# Patient Record
Sex: Male | Born: 1985 | Race: Black or African American | Hispanic: No | Marital: Single | Smoking: Former smoker
Health system: Southern US, Community
[De-identification: ages and names within clinical notes are randomized; demographics above are authoritative.]

## PROBLEM LIST (undated history)

## (undated) DIAGNOSIS — E0840 Diabetes mellitus due to underlying condition with diabetic neuropathy, unspecified: Secondary | ICD-10-CM

## (undated) DIAGNOSIS — E079 Disorder of thyroid, unspecified: Secondary | ICD-10-CM

## (undated) DIAGNOSIS — J45909 Unspecified asthma, uncomplicated: Secondary | ICD-10-CM

## (undated) DIAGNOSIS — I10 Essential (primary) hypertension: Secondary | ICD-10-CM

## (undated) DIAGNOSIS — E7800 Pure hypercholesterolemia, unspecified: Secondary | ICD-10-CM

## (undated) DIAGNOSIS — E119 Type 2 diabetes mellitus without complications: Secondary | ICD-10-CM

## (undated) HISTORY — PX: OTHER SURGICAL HISTORY: SHX169

---
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
## 2003-02-14 ENCOUNTER — Encounter: Payer: Self-pay | Admitting: Emergency Medicine

## 2003-02-14 ENCOUNTER — Emergency Department (HOSPITAL_COMMUNITY): Admission: EM | Admit: 2003-02-14 | Discharge: 2003-02-14 | Payer: Self-pay | Admitting: Emergency Medicine

## 2003-06-28 ENCOUNTER — Emergency Department (HOSPITAL_COMMUNITY): Admission: EM | Admit: 2003-06-28 | Discharge: 2003-06-28 | Payer: Self-pay | Admitting: Emergency Medicine

## 2006-10-02 ENCOUNTER — Emergency Department (HOSPITAL_COMMUNITY): Admission: EM | Admit: 2006-10-02 | Discharge: 2006-10-02 | Payer: Self-pay | Admitting: Emergency Medicine

## 2007-05-03 ENCOUNTER — Emergency Department (HOSPITAL_COMMUNITY): Admission: EM | Admit: 2007-05-03 | Discharge: 2007-05-03 | Payer: Self-pay | Admitting: Emergency Medicine

## 2007-05-04 ENCOUNTER — Emergency Department (HOSPITAL_COMMUNITY): Admission: EM | Admit: 2007-05-04 | Discharge: 2007-05-04 | Payer: Self-pay | Admitting: Emergency Medicine

## 2007-05-06 ENCOUNTER — Emergency Department (HOSPITAL_COMMUNITY): Admission: EM | Admit: 2007-05-06 | Discharge: 2007-05-06 | Payer: Self-pay | Admitting: Emergency Medicine

## 2008-03-16 ENCOUNTER — Emergency Department (HOSPITAL_COMMUNITY): Admission: EM | Admit: 2008-03-16 | Discharge: 2008-03-16 | Payer: Self-pay | Admitting: Emergency Medicine

## 2009-08-21 ENCOUNTER — Emergency Department (HOSPITAL_COMMUNITY): Admission: EM | Admit: 2009-08-21 | Discharge: 2009-08-21 | Payer: Self-pay | Admitting: Emergency Medicine

## 2009-08-23 ENCOUNTER — Emergency Department (HOSPITAL_COMMUNITY): Admission: EM | Admit: 2009-08-23 | Discharge: 2009-08-23 | Payer: Self-pay | Admitting: Emergency Medicine

## 2009-08-23 ENCOUNTER — Ambulatory Visit (HOSPITAL_COMMUNITY): Admission: RE | Admit: 2009-08-23 | Discharge: 2009-08-23 | Payer: Self-pay | Admitting: Family Medicine

## 2009-10-22 ENCOUNTER — Encounter: Admission: RE | Admit: 2009-10-22 | Discharge: 2009-10-22 | Payer: Self-pay | Admitting: Internal Medicine

## 2009-10-22 ENCOUNTER — Other Ambulatory Visit: Admission: RE | Admit: 2009-10-22 | Discharge: 2009-10-22 | Payer: Self-pay | Admitting: Diagnostic Radiology

## 2009-10-24 ENCOUNTER — Ambulatory Visit (HOSPITAL_COMMUNITY): Admission: RE | Admit: 2009-10-24 | Discharge: 2009-10-24 | Payer: Self-pay | Admitting: Otolaryngology

## 2009-12-18 ENCOUNTER — Encounter (INDEPENDENT_AMBULATORY_CARE_PROVIDER_SITE_OTHER): Payer: Self-pay | Admitting: Otolaryngology

## 2009-12-18 ENCOUNTER — Inpatient Hospital Stay (HOSPITAL_COMMUNITY): Admission: RE | Admit: 2009-12-18 | Discharge: 2009-12-20 | Payer: Self-pay | Admitting: Otolaryngology

## 2010-03-13 ENCOUNTER — Inpatient Hospital Stay (HOSPITAL_COMMUNITY): Admission: EM | Admit: 2010-03-13 | Discharge: 2010-03-20 | Payer: Self-pay | Admitting: Emergency Medicine

## 2010-03-13 ENCOUNTER — Emergency Department (HOSPITAL_COMMUNITY): Admission: EM | Admit: 2010-03-13 | Discharge: 2010-03-13 | Payer: Self-pay | Admitting: Emergency Medicine

## 2010-07-22 LAB — GLUCOSE, CAPILLARY
Glucose-Capillary: 145 mg/dL — ABNORMAL HIGH (ref 70–99)
Glucose-Capillary: 151 mg/dL — ABNORMAL HIGH (ref 70–99)
Glucose-Capillary: 161 mg/dL — ABNORMAL HIGH (ref 70–99)
Glucose-Capillary: 199 mg/dL — ABNORMAL HIGH (ref 70–99)
Glucose-Capillary: 202 mg/dL — ABNORMAL HIGH (ref 70–99)
Glucose-Capillary: 239 mg/dL — ABNORMAL HIGH (ref 70–99)
Glucose-Capillary: 245 mg/dL — ABNORMAL HIGH (ref 70–99)
Glucose-Capillary: 261 mg/dL — ABNORMAL HIGH (ref 70–99)
Glucose-Capillary: 262 mg/dL — ABNORMAL HIGH (ref 70–99)
Glucose-Capillary: 263 mg/dL — ABNORMAL HIGH (ref 70–99)
Glucose-Capillary: 276 mg/dL — ABNORMAL HIGH (ref 70–99)
Glucose-Capillary: 292 mg/dL — ABNORMAL HIGH (ref 70–99)
Glucose-Capillary: 298 mg/dL — ABNORMAL HIGH (ref 70–99)
Glucose-Capillary: 304 mg/dL — ABNORMAL HIGH (ref 70–99)
Glucose-Capillary: 370 mg/dL — ABNORMAL HIGH (ref 70–99)
Glucose-Capillary: 446 mg/dL — ABNORMAL HIGH (ref 70–99)

## 2010-07-22 LAB — CBC
HCT: 35.8 % — ABNORMAL LOW (ref 39.0–52.0)
Hemoglobin: 12.1 g/dL — ABNORMAL LOW (ref 13.0–17.0)
Hemoglobin: 14.5 g/dL (ref 13.0–17.0)
MCH: 29.5 pg (ref 26.0–34.0)
MCHC: 33.6 g/dL (ref 30.0–36.0)
MCV: 88 fL (ref 78.0–100.0)
MCV: 90.5 fL (ref 78.0–100.0)
Platelets: 192 10*3/uL (ref 150–400)
RBC: 3.96 MIL/uL — ABNORMAL LOW (ref 4.22–5.81)
RDW: 16.1 % — ABNORMAL HIGH (ref 11.5–15.5)
RDW: 16.2 % — ABNORMAL HIGH (ref 11.5–15.5)
WBC: 4.8 10*3/uL (ref 4.0–10.5)
WBC: 6 10*3/uL (ref 4.0–10.5)

## 2010-07-22 LAB — BASIC METABOLIC PANEL
BUN: 1 mg/dL — ABNORMAL LOW (ref 6–23)
BUN: 2 mg/dL — ABNORMAL LOW (ref 6–23)
BUN: 2 mg/dL — ABNORMAL LOW (ref 6–23)
BUN: 3 mg/dL — ABNORMAL LOW (ref 6–23)
BUN: 4 mg/dL — ABNORMAL LOW (ref 6–23)
CO2: 20 mEq/L (ref 19–32)
CO2: 21 mEq/L (ref 19–32)
CO2: 21 mEq/L (ref 19–32)
Calcium: 9.2 mg/dL (ref 8.4–10.5)
Chloride: 101 mEq/L (ref 96–112)
Chloride: 101 mEq/L (ref 96–112)
Chloride: 106 mEq/L (ref 96–112)
Chloride: 99 mEq/L (ref 96–112)
Creatinine, Ser: 0.98 mg/dL (ref 0.4–1.5)
Creatinine, Ser: 1.03 mg/dL (ref 0.4–1.5)
Creatinine, Ser: 1.05 mg/dL (ref 0.4–1.5)
Creatinine, Ser: 1.21 mg/dL (ref 0.4–1.5)
Creatinine, Ser: 1.27 mg/dL (ref 0.4–1.5)
GFR calc Af Amer: >60 mL/min (ref >60)
GFR calc Af Amer: >60 mL/min (ref >60)
GFR calc Af Amer: >60 mL/min (ref >60)
GFR calc Af Amer: >60 mL/min (ref >60)
GFR calc Af Amer: >60 mL/min (ref >60)
GFR calc Af Amer: >60 mL/min (ref >60)
GFR calc non Af Amer: >60 mL/min (ref >60)
GFR calc non Af Amer: >60 mL/min (ref >60)
GFR calc non Af Amer: >60 mL/min (ref >60)
GFR calc non Af Amer: >60 mL/min (ref >60)
GFR calc non Af Amer: >60 mL/min (ref >60)
GFR calc non Af Amer: >60 mL/min (ref >60)
Glucose, Bld: 199 mg/dL — ABNORMAL HIGH (ref 70–99)
Glucose, Bld: 230 mg/dL — ABNORMAL HIGH (ref 70–99)
Glucose, Bld: 304 mg/dL — ABNORMAL HIGH (ref 70–99)
Potassium: 2.7 mEq/L — CL (ref 3.5–5.1)
Potassium: 3.1 mEq/L — ABNORMAL LOW (ref 3.5–5.1)
Potassium: 3.2 mEq/L — ABNORMAL LOW (ref 3.5–5.1)
Potassium: 3.9 mEq/L (ref 3.5–5.1)
Potassium: 3.9 mEq/L (ref 3.5–5.1)
Potassium: 4.1 mEq/L (ref 3.5–5.1)
Sodium: 133 mEq/L — ABNORMAL LOW (ref 135–145)
Sodium: 136 mEq/L (ref 135–145)
Sodium: 136 mEq/L (ref 135–145)
Sodium: 143 mEq/L (ref 135–145)

## 2010-07-22 LAB — URINE MICROSCOPIC-ADD ON

## 2010-07-22 LAB — DIFFERENTIAL
Basophils Absolute: 0 10*3/uL (ref 0.0–0.1)
Eosinophils Relative: 2 % (ref 0–5)
Lymphocytes Relative: 21 % (ref 12–46)
Lymphs Abs: 1.3 10*3/uL (ref 0.7–4.0)
Monocytes Absolute: 0.5 10*3/uL (ref 0.1–1.0)
Monocytes Absolute: 0.6 10*3/uL (ref 0.1–1.0)
Monocytes Relative: 10 % (ref 3–12)
Monocytes Relative: 11 % (ref 3–12)
Neutro Abs: 4 10*3/uL (ref 1.7–7.7)

## 2010-07-22 LAB — URINALYSIS, ROUTINE W REFLEX MICROSCOPIC
Glucose, UA: 500 mg/dL — AB
Ketones, ur: >80 mg/dL — AB
Protein, ur: 30 mg/dL — AB

## 2010-07-25 LAB — COMPREHENSIVE METABOLIC PANEL
Alkaline Phosphatase: 58 U/L (ref 39–117)
BUN: 14 mg/dL (ref 6–23)
CO2: 26 mEq/L (ref 19–32)
Chloride: 105 mEq/L (ref 96–112)
GFR calc non Af Amer: >60 mL/min (ref >60)
Glucose, Bld: 127 mg/dL — ABNORMAL HIGH (ref 70–99)
Potassium: 4.4 mEq/L (ref 3.5–5.1)
Total Bilirubin: 0.3 mg/dL (ref 0.3–1.2)
Total Protein: 7.4 g/dL (ref 6.0–8.3)

## 2010-07-25 LAB — SURGICAL PCR SCREEN: Staphylococcus aureus: NEGATIVE

## 2010-07-25 LAB — CBC
HCT: 43.9 % (ref 39.0–52.0)
MCV: 88.9 fL (ref 78.0–100.0)
RDW: 13.2 % (ref 11.5–15.5)
WBC: 6.6 10*3/uL (ref 4.0–10.5)

## 2010-07-25 LAB — HEPATIC FUNCTION PANEL
Bilirubin, Direct: <0.1 mg/dL (ref 0.0–0.3)
Total Bilirubin: 0.4 mg/dL (ref 0.3–1.2)

## 2010-07-25 LAB — TSH: TSH: 1.125 u[IU]/mL (ref 0.350–4.500)

## 2010-07-25 LAB — CALCIUM
Calcium: 8 mg/dL — ABNORMAL LOW (ref 8.4–10.5)
Calcium: 8.5 mg/dL (ref 8.4–10.5)

## 2010-07-30 LAB — RAPID STREP SCREEN (MED CTR MEBANE ONLY): Streptococcus, Group A Screen (Direct): NEGATIVE

## 2011-01-21 IMAGING — US US SOFT TISSUE HEAD/NECK
1 series · 13 of 25 positions shown · non-contrast
Comparison: Neck CT from 08/13/2009

CLINICAL DATA: 24-year-old with palpable thyroid nodules.

THYROID ULTRASOUND
TECHNIQUE: Ultrasound examination of the thyroid gland and adjacent
soft tissues was performed.

[Series 1: us soft tissue head/neck · 0.13mm/px · 13 of 69 slices shown]
[im 1/69]
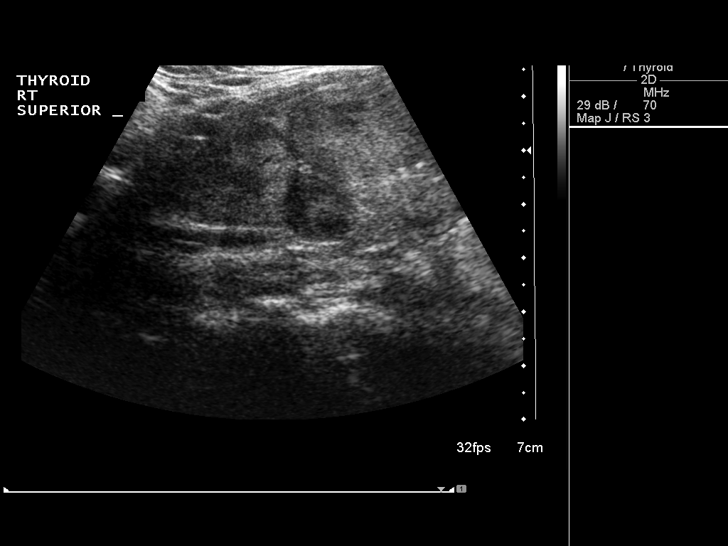
[im 6/69]
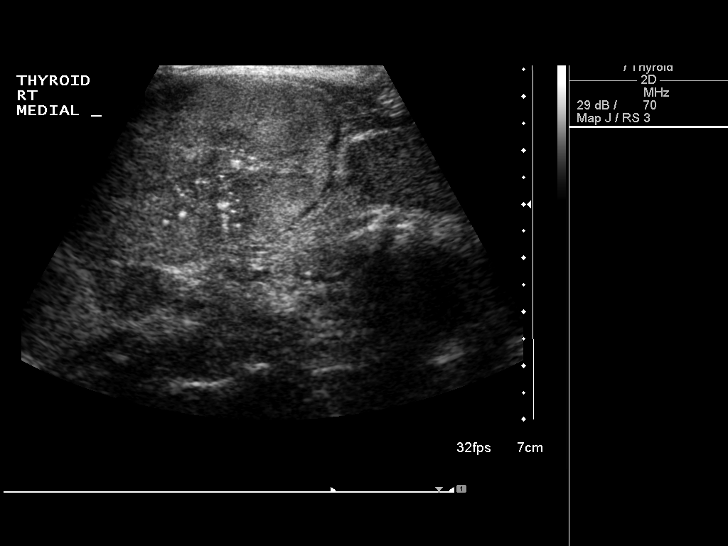
[im 12/69]
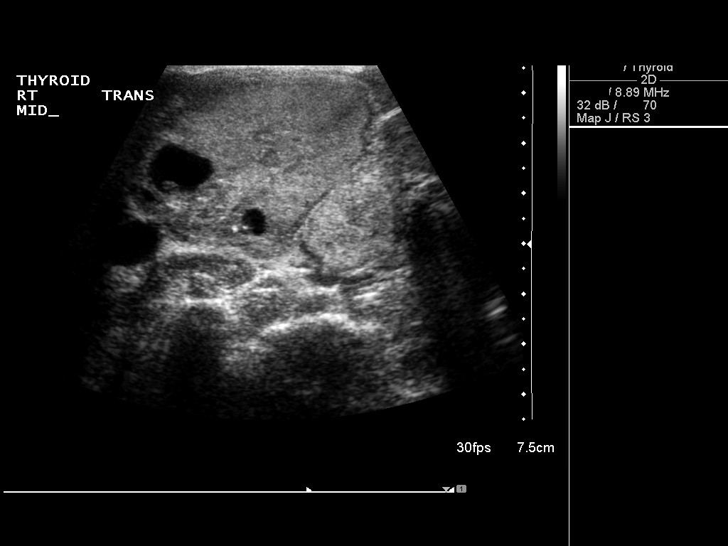
[im 18/69]
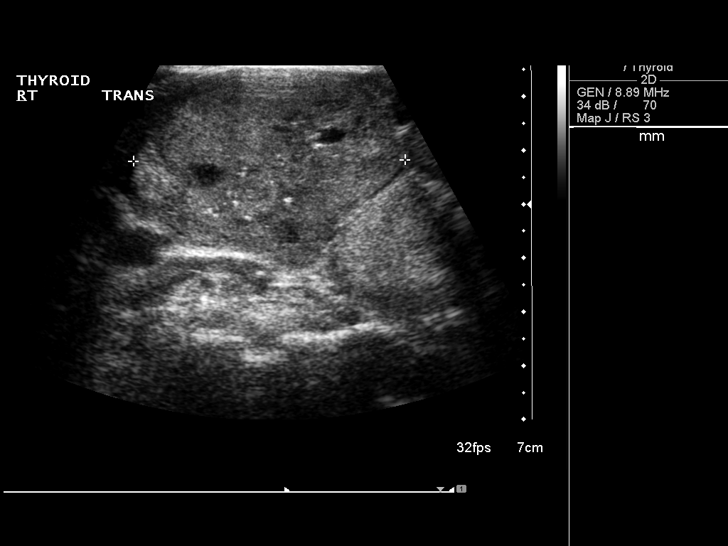
[im 23/69]
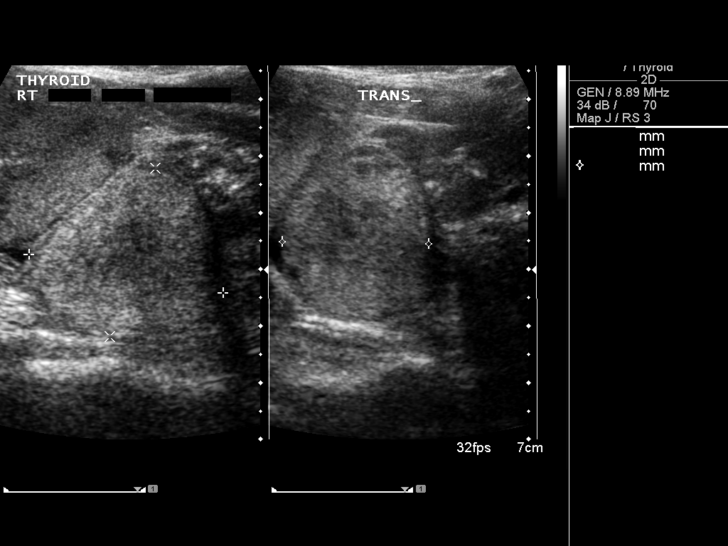
[im 29/69]
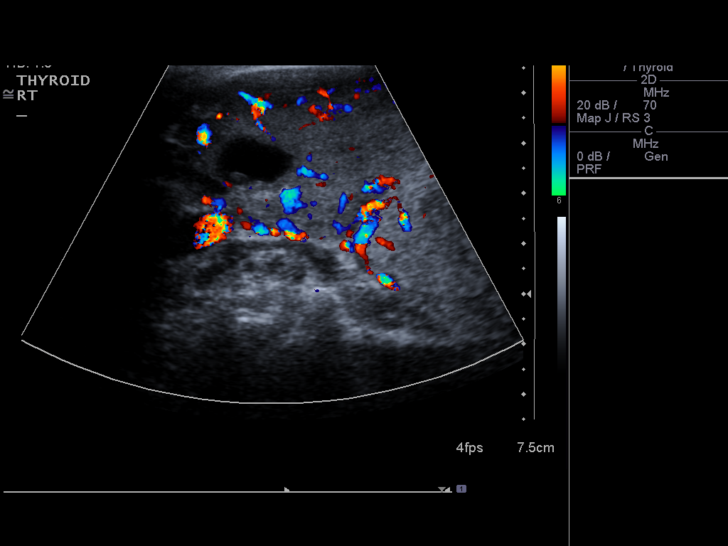
[im 35/69]
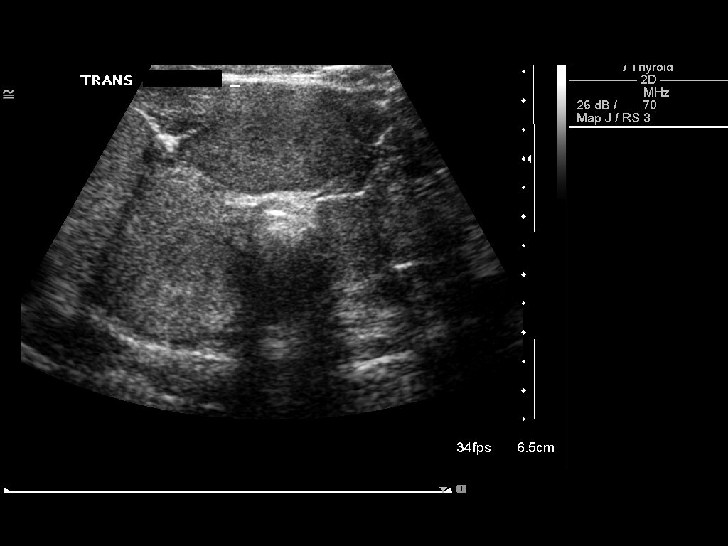
[im 40/69]
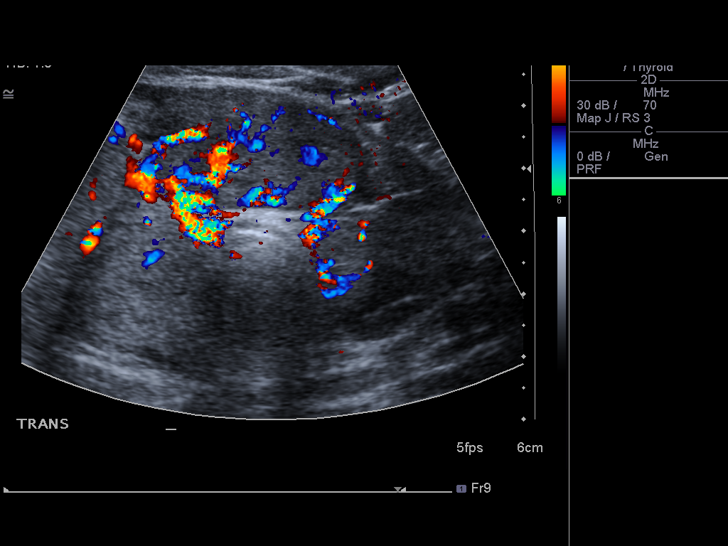
[im 46/69]
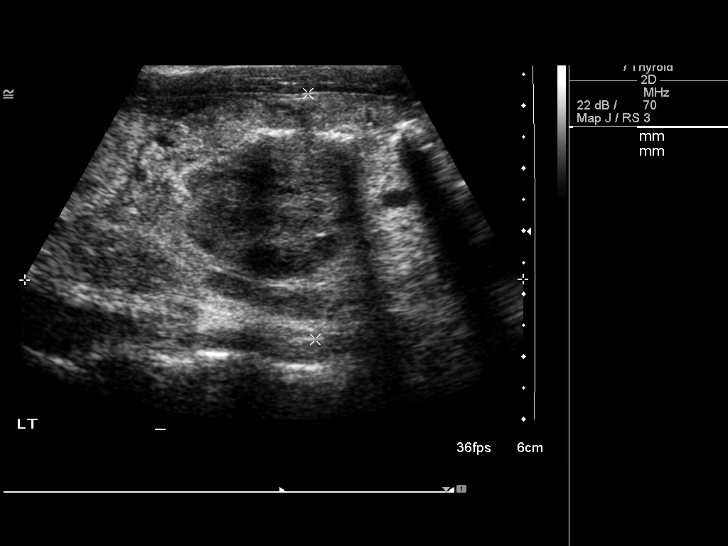
[im 52/69]
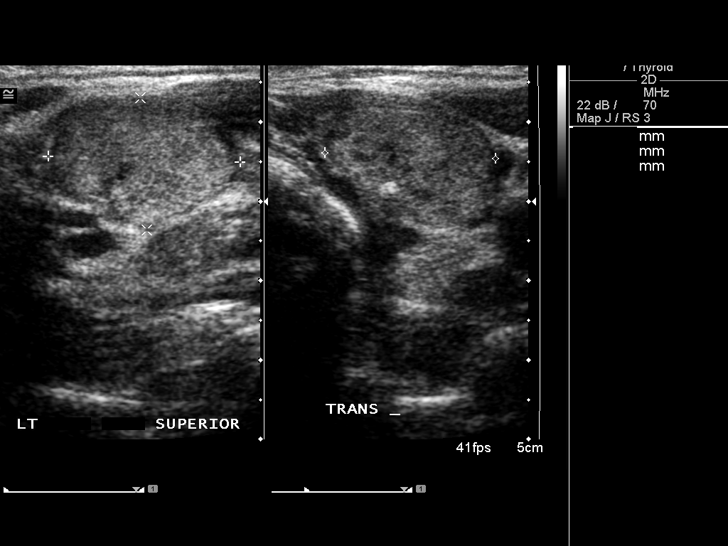
[im 57/69]
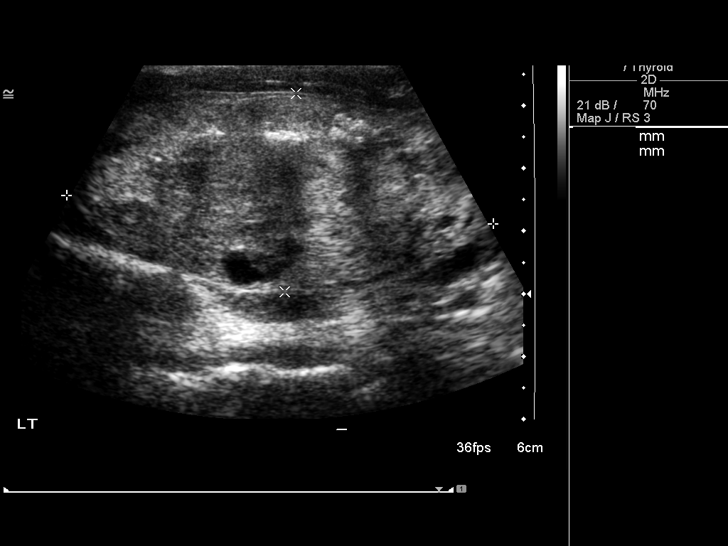
[im 63/69]
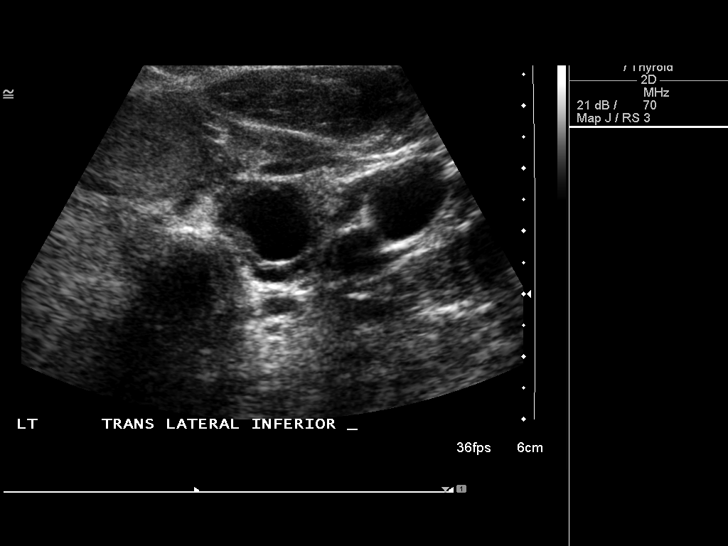
[im 69/69]
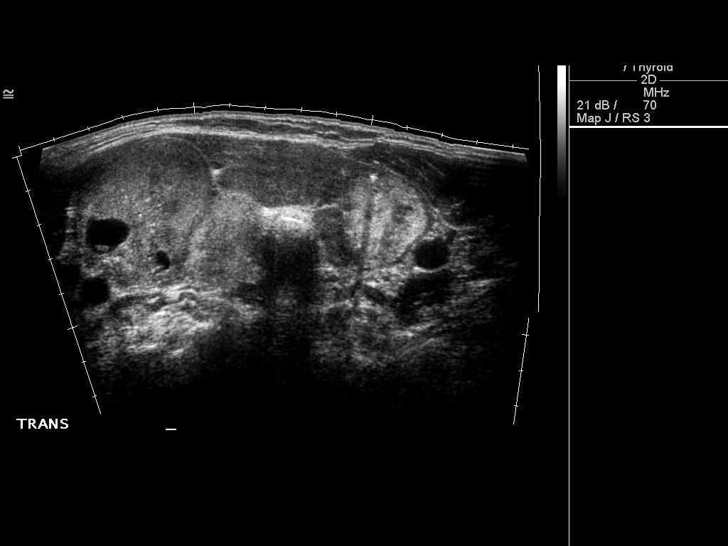

[13 of 25 positions shown; findings below may reference images not displayed]

FINDINGS: Right thyroid lobe:  10.5 x 4.1 x 6.3 cm
Left thyroid lobe:  9.1 x 3.9 x 4.2 cm
Isthmus:  2 cm

Focal nodules:  There are multiple bilateral solid nodules.  The
largest right thyroid nodule measures 6.2 x 3.4 x 5.1 cm and
contains microcalcifications and cysts.  There are at least four
additional large nodules in the right thyroid lobe.  The next
largest nodule is along the inferior pole measuring 4.1 x 3.1 x
cm. One of the smaller right thyroid nodules contains multiple
calcifications and measures up to 2.4 cm.  This 2.4 cm nodule is
located along the medial inferior aspect of the right thyroid lobe.

The isthmus nodule measures 5.5 x 1.7 x 3.4 cm.

Left thyroid lobe contains multiple nodules.  Dominant nodule is
complex with coarse calcifications and measures 6.8 x 3.2 x 4.1 cm.
There is a partially cystic lesion along the inferior aspect of the
left thyroid lobe measuring up to 3 cm.

Lymphadenopathy:  Absent
IMPRESSION: Multinodular goiter.  Multiple enlarged thyroid nodules containing
calcifications.   The dominant nodule in each thyroid lobe was fine
needle aspirated using ultrasound guidance following this
diagnostic procedure.

## 2011-01-23 IMAGING — CT CT NECK W/O CM
3 of 4 series · 16 of 33 positions shown, 19 images · non-contrast
Comparison: 08/23/2009

CLINICAL DATA: Goiter.  Evaluate response to Synthroid replacement.

CT NECK WITHOUT CONTRAST
TECHNIQUE: Multidetector CT imaging of the neck was performed
without intravenous contrast.

[Series 2: soft tissue neck 3.0 b31s · axial · 0.46mm/px · z∈[+32,+198]mm · 8 of 71 slices shown, 10 images]
[im 8/71  soft-tissue]
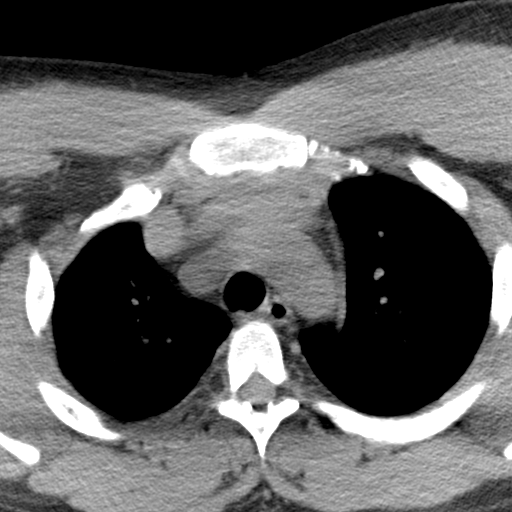
[im 8/71  bone]
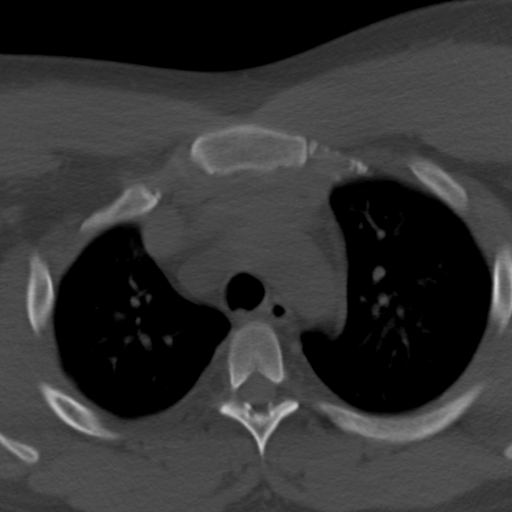
[im 16/71  bone]
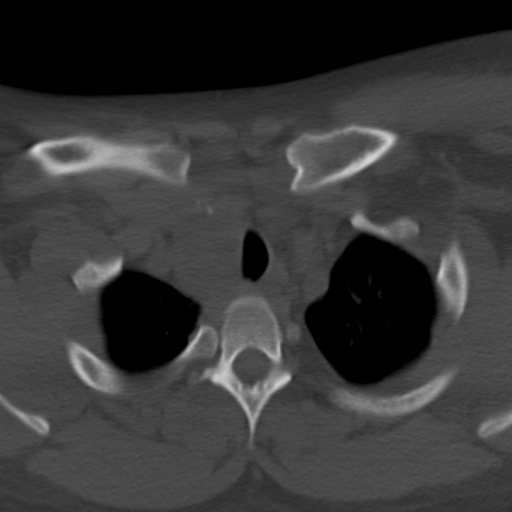
[im 24/71  bone]
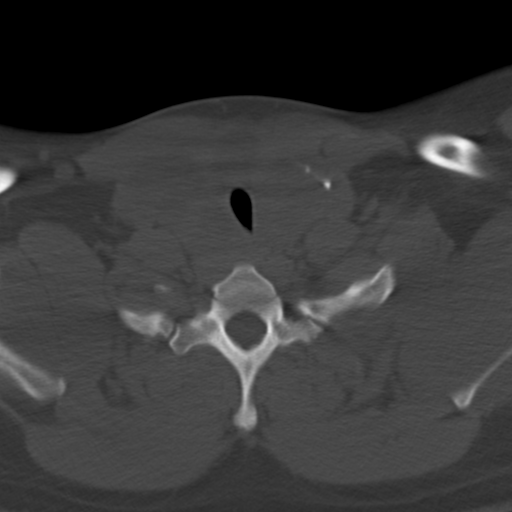
[im 32/71  bone]
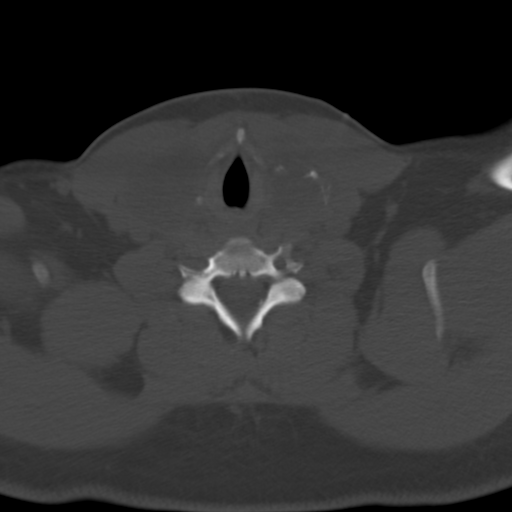
[im 39/71  soft-tissue]
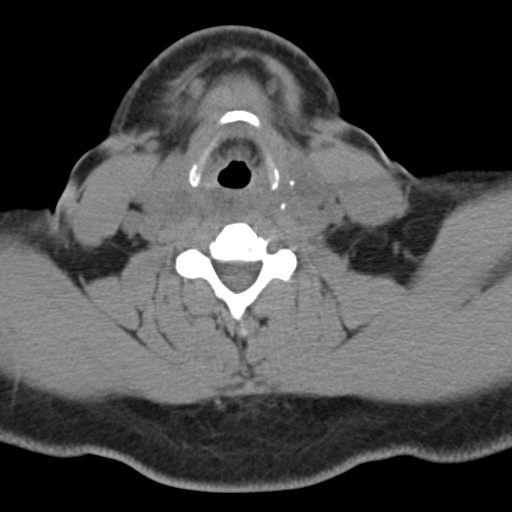
[im 39/71  bone]
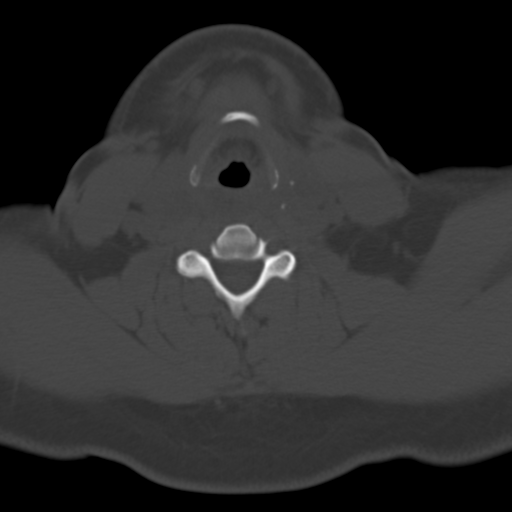
[im 47/71  bone]
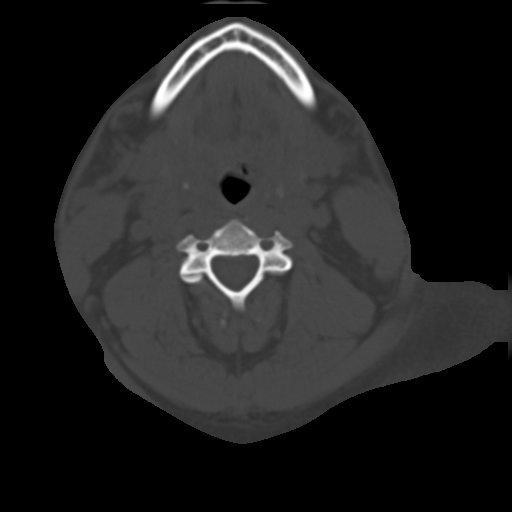
[im 55/71  bone]
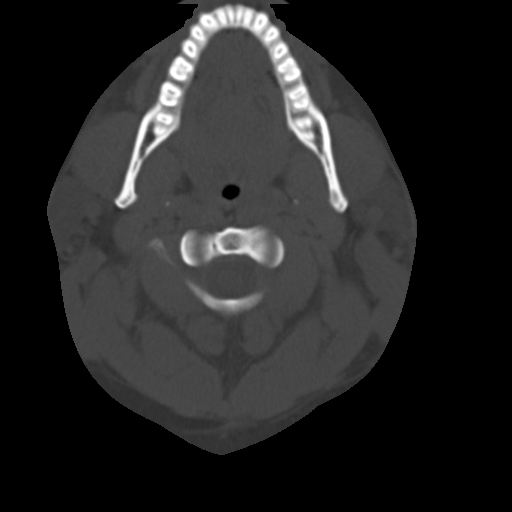
[im 63/71  bone]
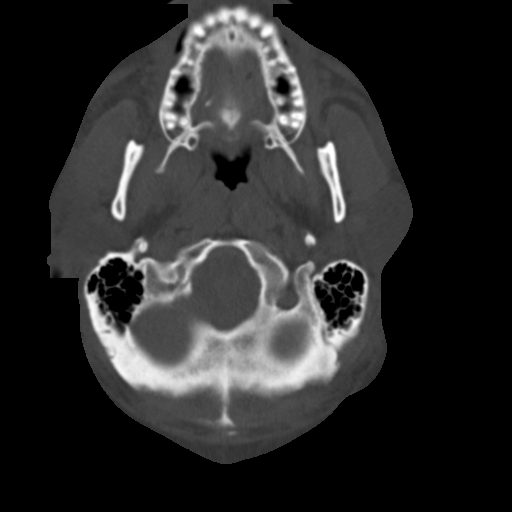

[Series 4: neck 3.0 soft tissue coronal · coronal · 0.35mm/px · 3 of 65 slices shown]
[im 13/65  bone]
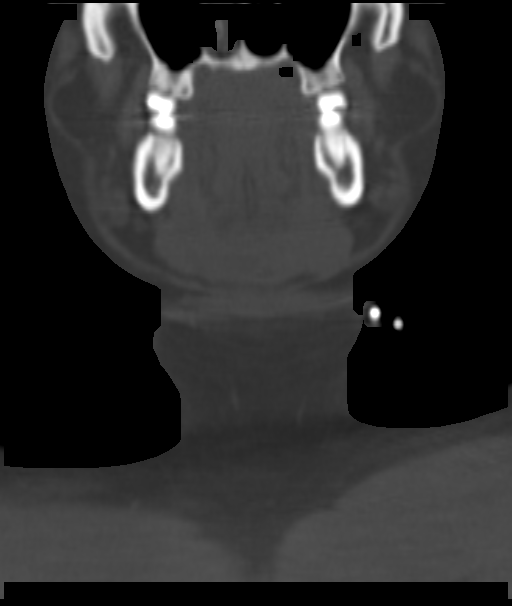
[im 26/65  bone]
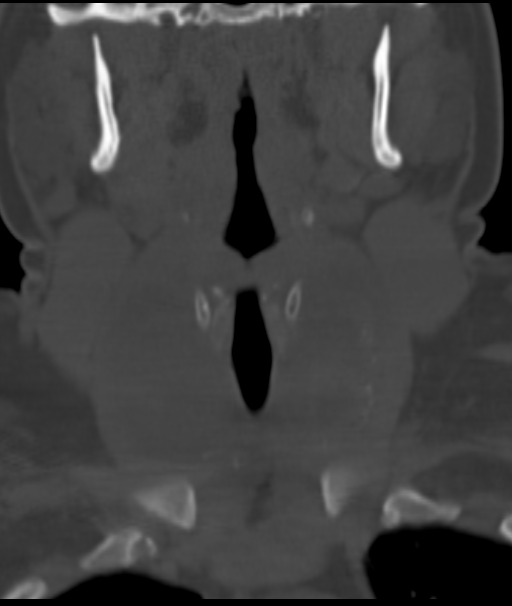
[im 39/65  bone]
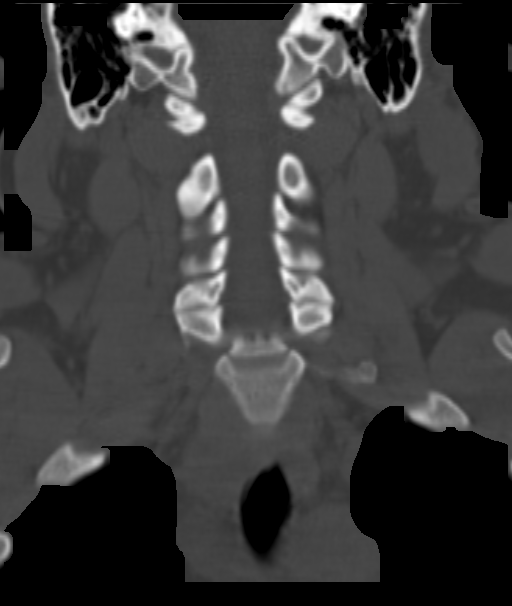

[Series 5: neck 3.0 soft tissue sag · sagittal · 0.41mm/px · 5 of 67 slices shown, 6 images]
[im 23/67  bone]
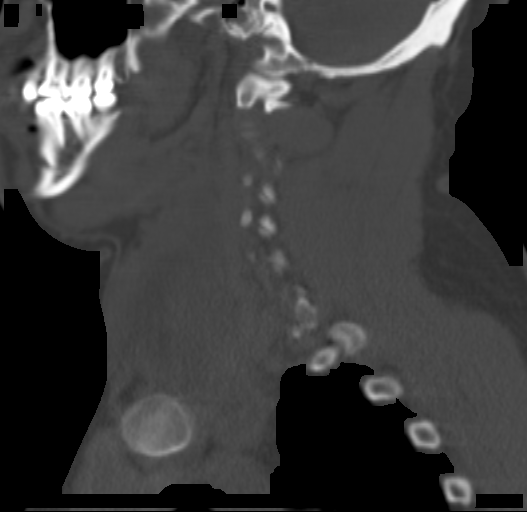
[im 28/67  bone]
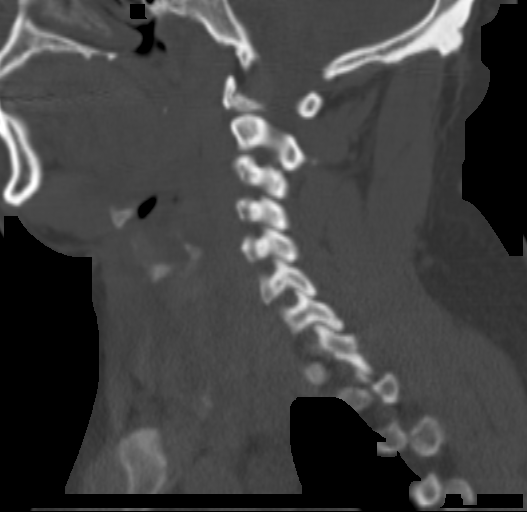
[im 34/67  soft-tissue]
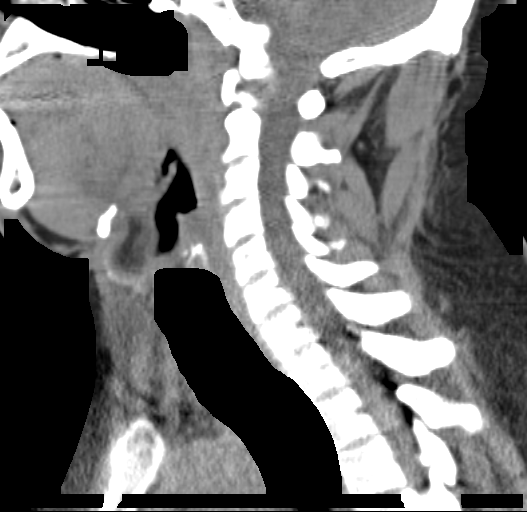
[im 34/67  bone]
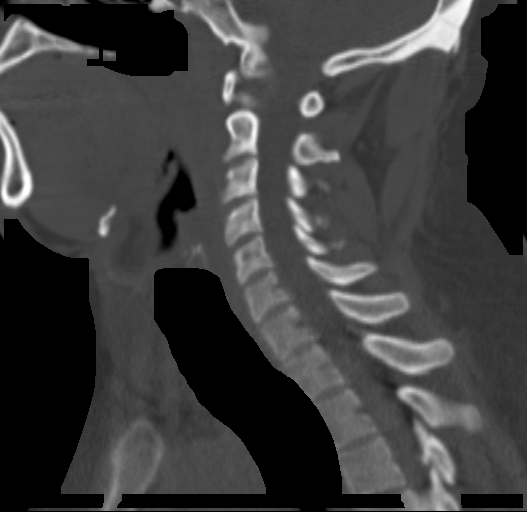
[im 39/67  bone]
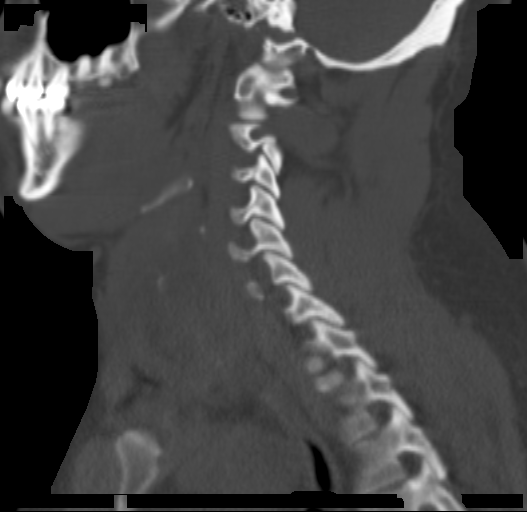
[im 45/67  bone]
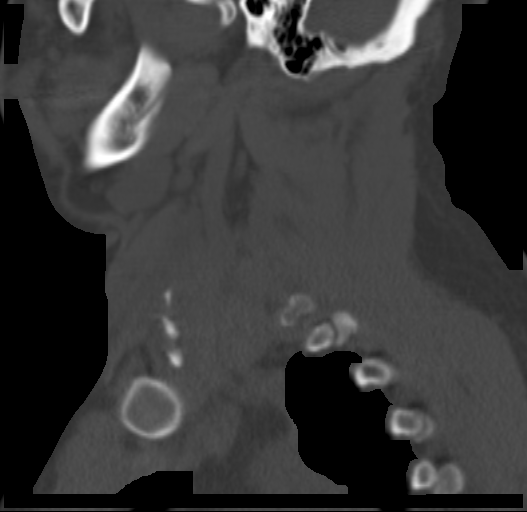

[16 of 33 positions shown; findings below may reference images not displayed]

FINDINGS: Again demonstrated is massive enlargement of the thyroid
gland with multiple nodules and scattered calcifications consistent
with multinodular goiter.  The gland is in fact somewhat smaller
than was seen previously.  Right to left measurement at the level
of the dens left lobe calcification is 11.2 cm as opposed to
cm previously.  Cephalocaudal measurement is also considerably
smaller, sagittal measurement of the right lobe decreasing from
12.5 cm to 9.7 cm.  There are no discernible enlarging foci.  There
is less mass effect upon the airway with the subglottic trachea
being slightly less compressed.

The other neck structures appear unremarkable.  Parotid and
submandibular glands appear normal.  No enlarged lymph nodes are
discernible.
IMPRESSION: Positive response to therapy.  Transverse measurement of the gland
is reduced from 12.8 cm to 11.2 cm. Cephalocaudal measurement is
reduced from 12.5 cm to 9.7 cm.  Less mass effect upon the
subglottic trachea.

## 2016-01-31 ENCOUNTER — Emergency Department (HOSPITAL_COMMUNITY)
Admission: EM | Admit: 2016-01-31 | Discharge: 2016-02-01 | Disposition: A | Discharge status: Home / Self Care | Payer: Medicare HMO | Attending: Emergency Medicine | Admitting: Emergency Medicine

## 2016-01-31 ENCOUNTER — Encounter (HOSPITAL_COMMUNITY): Payer: Self-pay

## 2016-01-31 DIAGNOSIS — J45909 Unspecified asthma, uncomplicated: Secondary | ICD-10-CM | POA: Diagnosis not present

## 2016-01-31 DIAGNOSIS — F10920 Alcohol use, unspecified with intoxication, uncomplicated: Secondary | ICD-10-CM

## 2016-01-31 DIAGNOSIS — F10120 Alcohol abuse with intoxication, uncomplicated: Secondary | ICD-10-CM | POA: Insufficient documentation

## 2016-01-31 DIAGNOSIS — R443 Hallucinations, unspecified: Secondary | ICD-10-CM | POA: Diagnosis not present

## 2016-01-31 DIAGNOSIS — F17210 Nicotine dependence, cigarettes, uncomplicated: Secondary | ICD-10-CM | POA: Insufficient documentation

## 2016-01-31 DIAGNOSIS — Z79899 Other long term (current) drug therapy: Secondary | ICD-10-CM | POA: Diagnosis not present

## 2016-01-31 DIAGNOSIS — F10929 Alcohol use, unspecified with intoxication, unspecified: Secondary | ICD-10-CM

## 2016-01-31 DIAGNOSIS — Z7984 Long term (current) use of oral hypoglycemic drugs: Secondary | ICD-10-CM | POA: Diagnosis not present

## 2016-01-31 DIAGNOSIS — Z046 Encounter for general psychiatric examination, requested by authority: Secondary | ICD-10-CM | POA: Diagnosis present

## 2016-01-31 DIAGNOSIS — I10 Essential (primary) hypertension: Secondary | ICD-10-CM | POA: Diagnosis not present

## 2016-01-31 DIAGNOSIS — E119 Type 2 diabetes mellitus without complications: Secondary | ICD-10-CM | POA: Insufficient documentation

## 2016-01-31 HISTORY — DX: Essential (primary) hypertension: I10

## 2016-01-31 HISTORY — DX: Type 2 diabetes mellitus without complications: E11.9

## 2016-01-31 HISTORY — DX: Unspecified asthma, uncomplicated: J45.909

## 2016-01-31 NOTE — ED Triage Notes (Signed)
Pt in by [facility] for being extremely intoxicated and making threats about harming other family members as well as about harming himself.  Pt states he is "crazy, but I'm a psychopath"

## 2016-02-01 DIAGNOSIS — F10929 Alcohol use, unspecified with intoxication, unspecified: Secondary | ICD-10-CM

## 2016-02-01 DIAGNOSIS — F10120 Alcohol abuse with intoxication, uncomplicated: Secondary | ICD-10-CM

## 2016-02-01 LAB — COMPREHENSIVE METABOLIC PANEL
ALT: 27 U/L (ref 17–63)
AST: 19 U/L (ref 15–41)
Albumin: 4.7 g/dL (ref 3.5–5.0)
Alkaline Phosphatase: 48 U/L (ref 38–126)
Anion gap: 12 (ref 5–15)
BILIRUBIN TOTAL: 0.3 mg/dL (ref 0.3–1.2)
BUN: 6 mg/dL (ref 6–20)
CALCIUM: 8.6 mg/dL — AB (ref 8.9–10.3)
CHLORIDE: 103 mmol/L (ref 101–111)
CO2: 23 mmol/L (ref 22–32)
CREATININE: 0.95 mg/dL (ref 0.61–1.24)
Glucose, Bld: 260 mg/dL — ABNORMAL HIGH (ref 65–99)
Potassium: 3.6 mmol/L (ref 3.5–5.1)
Sodium: 138 mmol/L (ref 135–145)
TOTAL PROTEIN: 8.3 g/dL — AB (ref 6.5–8.1)

## 2016-02-01 LAB — ETHANOL: ALCOHOL ETHYL (B): 255 mg/dL — AB (ref <5)

## 2016-02-01 LAB — CBC WITH DIFFERENTIAL/PLATELET
BASOS ABS: 0 10*3/uL (ref 0.0–0.1)
Basophils Relative: 0 %
EOS PCT: 2 %
Eosinophils Absolute: 0.1 10*3/uL (ref 0.0–0.7)
HEMATOCRIT: 42.4 % (ref 39.0–52.0)
Hemoglobin: 14.4 g/dL (ref 13.0–17.0)
LYMPHS ABS: 1.7 10*3/uL (ref 0.7–4.0)
LYMPHS PCT: 25 %
MCH: 30.8 pg (ref 26.0–34.0)
MCHC: 34 g/dL (ref 30.0–36.0)
MCV: 90.6 fL (ref 78.0–100.0)
MONO ABS: 0.4 10*3/uL (ref 0.1–1.0)
Monocytes Relative: 5 %
NEUTROS ABS: 4.6 10*3/uL (ref 1.7–7.7)
Neutrophils Relative %: 68 %
PLATELETS: 244 10*3/uL (ref 150–400)
RBC: 4.68 MIL/uL (ref 4.22–5.81)
RDW: 13.2 % (ref 11.5–15.5)
WBC: 6.9 10*3/uL (ref 4.0–10.5)

## 2016-02-01 LAB — RAPID URINE DRUG SCREEN, HOSP PERFORMED
Amphetamines: NOT DETECTED
Barbiturates: NOT DETECTED
Benzodiazepines: NOT DETECTED
Cocaine: NOT DETECTED
OPIATES: NOT DETECTED
Tetrahydrocannabinol: NOT DETECTED

## 2016-02-01 MED ORDER — LORAZEPAM 1 MG PO TABS: 1.0000 mg | ORAL_TABLET | Freq: Three times a day (TID) | ORAL | Status: DC | PRN

## 2016-02-01 MED ORDER — IBUPROFEN 400 MG PO TABS: 600.0000 mg | ORAL_TABLET | Freq: Three times a day (TID) | ORAL | Status: DC | PRN

## 2016-02-01 MED ORDER — ONDANSETRON HCL 4 MG PO TABS: 4.0000 mg | ORAL_TABLET | Freq: Three times a day (TID) | ORAL | Status: DC | PRN

## 2016-02-01 MED ORDER — NICOTINE 21 MG/24HR TD PT24: 21.0000 mg | MEDICATED_PATCH | Freq: Every day | TRANSDERMAL | Status: DC

## 2016-02-01 MED ORDER — ZOLPIDEM TARTRATE 5 MG PO TABS: 5.0000 mg | ORAL_TABLET | Freq: Every evening | ORAL | Status: DC | PRN

## 2016-02-01 MED ORDER — ACETAMINOPHEN 325 MG PO TABS: 650.0000 mg | ORAL_TABLET | ORAL | Status: DC | PRN

## 2016-02-01 MED ORDER — ALUM & MAG HYDROXIDE-SIMETH 200-200-20 MG/5ML PO SUSP
30.0000 mL | ORAL | Status: DC | PRN
Start: 2016-02-01 — End: 2016-02-01

## 2016-02-01 NOTE — Consult Note (Signed)
Telepsych Consultation   Reason for Consult:  Alcohol related SI Referring Physician: EDP Patient Identification: Craig Fischer MRN:  [id number] Principal Diagnosis: Alcohol intoxication [facility]) Diagnosis:   Patient Active Problem List   Diagnosis Date Noted  . Alcohol intoxication ([place]) [F10.129] 02/01/2016    Total Time spent with patient: 30 minutes  Subjective:   Craig Fischer is a 30 y.o. male patient admitted voluntarily to [facility] intoxicated reporting suicidal thoughts and upset over conflict with a cousin.   HPI:     Per initial Tele Assessment note:   Craig Fischer is an 30 y.o. single male who presents unaccompanied to [facility] ED. Pt is currently intoxicated with a blood alcohol of 255 and is a poor historian. Pt reports he came to the ED because he is "crazy in the head." He is unable to specify if anything hap pended today that contributed to this crisis. Pt reports suicidal ideation with "plenty of ways" to kill himself. He states he has tried to hand himself in the past. Pt states that he has thoughts of killing his cousin but denies current plan or intent "unless he disrespects my aunt." Pt reports he is experiencing auditory hallucinations but cannot describe anything about the nature of the hallucinations. Pt admits to drinking alcohol twice per month on average but cannot say how much he drinks or how much he drank within the past twenty-four hours. Pt denies substance use and urine drug screen is negative.  Pt reports he lives alone. He says he has a girlfriend who is supportive. Pt says he is receiving disability but is unable to explain the nature of his disability. He states he has no current outpatient mental health providers and Pt is unable to say if he has ever received outpatient mental health treatment. Pt denies any history of inpatient psychiatric or substance abuse treatment.  Patient spent the night in the [facility] after noted to be intoxicated during  initial assessment by counselor. He is calm and cooperative the morning of 02/01/2016. Patient states "I guess I snapped after my cousin kept telling people I'm gay. I am not. I am proposing to my girlfriend soon. I talked to my family who really helped calm me down. The more I thought about it the more I realized it's not worth it. Sometimes I feel depressed. I have heard voices before but not today. I would be willing to see a Psychiatrist. I have never had any mental health treatment before. I just want to go home to watch TV and listen to my music. I am going to stay away from the guy who upset me and caused me to snap. I feel alright now. My family really helped me calm down. I do not want to hurt myself or anyone else. I am not hearing a voices." Patient was alert and oriented times four. There were no signs of acute intoxication from alcohol. He denies any regular pattern of abuse. He is motivated to follow up outpatient to address family stressors and obtain help for report of some mild depressive symptoms. Spoke with Dr. Shea Evans who agrees the patient is stable to discharge home today. Discussed disposition with Dr. Lacinda Axon. Recommend that patient follow up with [facility] to address depressive symptoms and possible alcohol abuse. Patient denies any prior outpatient or inpatient treatment.    Past Psychiatric History: Denies  Risk to Self: Suicidal Ideation: Denies Suicidal Intent: No Is patient at risk for suicide?: Yes Suicidal Plan?: Denies Specify  Current Suicidal Plan: Denies Access to Means: Yes Specify Access to Suicidal Means: Denies What has been your use of drugs/alcohol within the last 12 months?: Pt is abusing alcohol How many times?: 0 (Unknown) Other Self Harm Risks: None Triggers for Past Attempts: Unknown Intentional Self Injurious Behavior: None Risk to Others: Homicidal Ideation: Denies currently Thoughts of Harm to Others: Denies currently  Comment - Thoughts of Harm to  Others: Denies this during assessment 02/01/2016  Current Homicidal Intent: No Current Homicidal Plan: No Access to Homicidal Means: No Identified Victim: Cousin History of harm to others?: No Assessment of Violence: None Noted Violent Behavior Description: Pt denies history of violence Does patient have access to weapons?: No Criminal Charges Pending?: No Does patient have a court date: No Prior Inpatient Therapy: Prior Inpatient Therapy: No Prior Therapy Dates: NA Prior Therapy Facilty/Provider(s): NA Reason for Treatment: NA Prior Outpatient Therapy: Prior Outpatient Therapy: No Prior Therapy Dates: NA Prior Therapy Facilty/Provider(s): NA Reason for Treatment: NA Does patient have an ACCT team?: No Does patient have Intensive In-House Services?  : No Does patient have [facility] services? : No Does patient have [facility] services?: No  Past Medical History:  Past Medical History:  Diagnosis Date  . Asthma   . Diabetes mellitus without complication ([place])   . Hypertension    History reviewed. No pertinent surgical history. Family History: No family history on file. Family Psychiatric  History: Denies Social History:  History  Alcohol Use  . Yes     History  Drug Use No    Social History   Social History  . Marital status: Single    Spouse name: N/A  . Number of children: N/A  . Years of education: N/A   Social History Main Topics  . Smoking status: Current Every Day Smoker  . Smokeless tobacco: Never Used  . Alcohol use Yes  . Drug use: No  . Sexual activity: Not Asked   Other Topics Concern  . None   Social History Narrative  . None   Additional Social History:    Allergies:   Allergies  Allergen Reactions  . Hydrocodone Other (See Comments)    Panic attack    Labs:  Results for orders placed or performed during the hospital encounter of 01/31/16 (from the past 48 hour(s))  CBC with Differential     Status: None   Collection Time: 02/01/16  1:12 AM   Result Value Ref Range   WBC 6.9 4.0 - 10.5 K/uL   RBC 4.68 4.22 - 5.81 MIL/uL   Hemoglobin 14.4 13.0 - 17.0 g/dL   HCT 42.4 39.0 - 52.0 %   MCV 90.6 78.0 - 100.0 fL   MCH 30.8 26.0 - 34.0 pg   MCHC 34.0 30.0 - 36.0 g/dL   RDW 13.2 11.5 - 15.5 %   Platelets 244 150 - 400 K/uL   Neutrophils Relative % 68 %   Neutro Abs 4.6 1.7 - 7.7 K/uL   Lymphocytes Relative 25 %   Lymphs Abs 1.7 0.7 - 4.0 K/uL   Monocytes Relative 5 %   Monocytes Absolute 0.4 0.1 - 1.0 K/uL   Eosinophils Relative 2 %   Eosinophils Absolute 0.1 0.0 - 0.7 K/uL   Basophils Relative 0 %   Basophils Absolute 0.0 0.0 - 0.1 K/uL  Comprehensive metabolic panel     Status: Abnormal   Collection Time: 02/01/16  1:12 AM  Result Value Ref Range   Sodium 138 135 - 145 mmol/L  Potassium 3.6 3.5 - 5.1 mmol/L   Chloride 103 101 - 111 mmol/L   CO2 23 22 - 32 mmol/L   Glucose, Bld 260 (H) 65 - 99 mg/dL   BUN 6 6 - 20 mg/dL   Creatinine, Ser 0.95 0.61 - 1.24 mg/dL   Calcium 8.6 (L) 8.9 - 10.3 mg/dL   Total Protein 8.3 (H) 6.5 - 8.1 g/dL   Albumin 4.7 3.5 - 5.0 g/dL   AST 19 15 - 41 U/L   ALT 27 17 - 63 U/L   Alkaline Phosphatase 48 38 - 126 U/L   Total Bilirubin 0.3 0.3 - 1.2 mg/dL   GFR calc non Af Amer >60 >60 mL/min   GFR calc Af Amer >60 >60 mL/min    Comment: (NOTE) The eGFR has been calculated using the CKD EPI equation. This calculation has not been validated in all clinical situations. eGFR's persistently <60 mL/min signify possible Chronic Kidney Disease.    Anion gap 12 5 - 15  Ethanol     Status: Abnormal   Collection Time: 02/01/16  1:12 AM  Result Value Ref Range   Alcohol, Ethyl (B) 255 (H) <5 mg/dL    Comment:        LOWEST DETECTABLE LIMIT FOR SERUM ALCOHOL IS 5 mg/dL FOR MEDICAL PURPOSES ONLY   Rapid urine drug screen (hospital performed)     Status: None   Collection Time: 02/01/16  1:30 AM  Result Value Ref Range   Opiates NONE DETECTED NONE DETECTED   Cocaine NONE DETECTED NONE  DETECTED   Benzodiazepines NONE DETECTED NONE DETECTED   Amphetamines NONE DETECTED NONE DETECTED   Tetrahydrocannabinol NONE DETECTED NONE DETECTED   Barbiturates NONE DETECTED NONE DETECTED    Comment:        DRUG SCREEN FOR MEDICAL PURPOSES ONLY.  IF CONFIRMATION IS NEEDED FOR ANY PURPOSE, NOTIFY LAB WITHIN 5 DAYS.        LOWEST DETECTABLE LIMITS FOR URINE DRUG SCREEN Drug Class       Cutoff (ng/mL) Amphetamine      1000 Barbiturate      200 Benzodiazepine   831 Tricyclics       517 Opiates          300 Cocaine          300 THC              50     Current Facility-Administered Medications  Medication Dose Route Frequency Provider Last Rate Last Dose  . acetaminophen (TYLENOL) tablet 650 mg  650 mg Oral O1Y PRN Delora Fuel, MD      . alum & mag hydroxide-simeth (MAALOX/MYLANTA) 200-200-20 MG/5ML suspension 30 mL  30 mL Oral PRN Delora Fuel, MD      . ibuprofen (ADVIL,MOTRIN) tablet 600 mg  600 mg Oral W7P PRN Delora Fuel, MD      . LORazepam (ATIVAN) tablet 1 mg  1 mg Oral X1G PRN Delora Fuel, MD      . nicotine (NICODERM CQ - dosed in mg/24 hours) patch 21 mg  21 mg Transdermal Daily Delora Fuel, MD   Stopped at 02/01/16 [phone]  . ondansetron (ZOFRAN) tablet 4 mg  4 mg Oral S8N PRN Delora Fuel, MD      . zolpidem [facility]) tablet 5 mg  5 mg Oral QHS PRN Delora Fuel, MD       No current outpatient prescriptions on file.    Musculoskeletal:  Unable to assess via camera  Psychiatric Specialty Exam: Physical Exam  Review of Systems  Constitutional: Negative.   HENT: Negative.   Eyes: Negative.   Respiratory: Negative.   Cardiovascular: Negative.   Gastrointestinal: Negative.   Genitourinary: Negative.   Musculoskeletal: Negative.   Skin: Negative.   Neurological: Negative.   Endo/Heme/Allergies: Negative.   Psychiatric/Behavioral: Positive for depression (Reports mild depressive symptoms) and substance abuse. Negative for hallucinations, memory loss and suicidal  ideas. The patient is not nervous/anxious and does not have insomnia.     Blood pressure 125/87, pulse 98, temperature 97.6 F (36.4 C), temperature source Oral, resp. rate 20, height 5' 8" (1.727 m), weight 121.6 kg (268 lb), SpO2 97 %.Body mass index is 40.75 kg/m.  General Appearance: Casual  Eye Contact:  Good  Speech:  Clear and Coherent  Volume:  Normal  Mood:  Euthymic  Affect:  Appropriate  Thought Process:  Coherent and Goal Directed  Orientation:  Full (Time, Place, and Person)  Thought Content:  WDL  Suicidal Thoughts:  No  Homicidal Thoughts:  No  Memory:  Immediate;   Good Recent;   Good Remote;   Good  Judgement:  Fair  Insight:  Present  Psychomotor Activity:  Normal  Concentration:  Concentration: Good and Attention Span: Good  Recall:  Good  Fund of Knowledge:  Good  Language:  Good  Akathisia:  No  Handed:  Right  AIMS (if indicated):     Assets:  Communication Skills Desire for Improvement Housing Intimacy Leisure Time Physical Health Resilience Social Support Talents/Skills  ADL's:  Intact  Cognition:  WNL  Sleep:        Treatment Plan Summary: Patient does not meet criteria for inpatient admission at this time. Is willing to follow up with outpatient treatment to pursue resources in the [place] area that accept his medicaid.   Disposition: No evidence of imminent risk to self or others at present.   Patient does not meet criteria for psychiatric inpatient admission. Supportive therapy provided about ongoing stressors.  Elmarie Shiley, NP 02/01/2016 10:52 AM

## 2016-02-01 NOTE — ED Notes (Signed)
TTS machine placed at bedside.   

## 2016-02-01 NOTE — ED Notes (Signed)
 PD signoff on patient, patient has sitter at bedside.

## 2016-02-01 NOTE — ED Provider Notes (Signed)
No suicidal or homicidal ideation. Behavioral health consult obtained. Patient does not meet inpatient criteria. Will discharge to outpatient counseling.   Donnetta HutchingBrian Jonnette Nuon, MD 02/01/16 1252

## 2016-02-01 NOTE — BH Assessment (Signed)
Tele Assessment Note   Craig StagersMarcus M Limpert is an 30 y.o. single male who presents unaccompanied to [facility] ED. Pt is currently intoxicated with a blood alcohol of 255 and is a poor historian. Pt reports he came to the ED because he is "crazy in the head." He is unable to specify if anything hap pended today that contributed to this crisis. Pt reports suicidal ideation with "plenty of ways" to kill himself. He states he has tried to hand himself in the past. Pt states that he has thoughts of killing his cousin but denies current plan or intent "unless he disrespects my aunt." Pt reports he is experiencing auditory hallucinations but cannot describe anything about the nature of the hallucinations. Pt admits to drinking alcohol twice per month on average but cannot say how much he drinks or how much he drank within the past twenty-four hours. Pt denies substance use and urine drug screen is negative.  Pt reports he lives alone. He says he has a girlfriend who is supportive. Pt says he is receiving disability but is unable to explain the nature of his disability. He states he has no current outpatient mental health providers and Pt is unable to say if he has ever received outpatient mental health treatment. Pt denies any history of inpatient psychiatric or substance abuse treatment.  Pt is dressed in hospital scrubs bottom with no top. He is drowsy and oriented to person, place and situation but not date. His speech is slurred and motor behavior appear to be within normal limits. Eye contact is poor and Pt often covered his eyes with his arm or a blanket. Pt's mood is irritable and affect is congruent with mood. Thought process is coherent but tangential at times. Pt gave brief answers to questions. Pt states he is willing to sign himself into a psychiatric facility "as long as it doesn't bother my landlord."   Diagnosis: Alcohol Use Disorder; Unspecified Psychotic Disorder  Past Medical History:  Past  Medical History:  Diagnosis Date  . Asthma   . Diabetes mellitus without complication (HCC)   . Hypertension     History reviewed. No pertinent surgical history.  Family History: No family history on file.  Social History:  reports that he has been smoking.  He has never used smokeless tobacco. He reports that he drinks alcohol. He reports that he does not use drugs.  Additional Social History:  Alcohol / Drug Use Pain Medications: Denies use Prescriptions: See MAR Over the Counter: See MAR History of alcohol / drug use?: Yes Longest period of sobriety (when/how long): Unknown Substance #1 Name of Substance 1: Alcohol 1 - Age of First Use: Unknown 1 - Amount (size/oz): Pt unable to estimate 1 - Frequency: Averages twice per month 1 - Duration: Ongoing 1 - Last Use / Amount: 01/31/16, unknown  CIWA: CIWA-Ar BP: 135/87 Pulse Rate: (!) 140 COWS:    PATIENT STRENGTHS: (choose at least two) Capable of independent living Manufacturing systems engineerCommunication skills Motivation for treatment/growth Physical Health Supportive family/friends  Allergies:  Allergies  Allergen Reactions  . Hydrocodone Other (See Comments)    Panic attack    Home Medications:  (Not in a hospital admission)  OB/GYN Status:  No LMP for male patient.  General Assessment Data Location of Assessment: [facility] TTS Assessment: In system Is this a Tele or Face-to-Face Assessment?: Tele Assessment Is this an Initial Assessment or a Re-assessment for this encounter?: Initial Assessment Marital status: Single Maiden name: NA Is patient  pregnant?: No Pregnancy Status: No Living Arrangements: Alone Can pt return to current living arrangement?: Yes Admission Status: Involuntary Is patient capable of signing voluntary admission?: Yes Referral Source: Self/Family/Friend Insurance type: Medicaid     Crisis Care Plan Living Arrangements: Alone Legal Guardian: Other: (Self) Name of Psychiatrist: None Name of Therapist:  None  Education Status Is patient currently in school?: No Current Grade: NA Highest grade of school patient has completed: 12 Name of school: NA Contact person: NA  Risk to self with the past 6 months Suicidal Ideation: Yes-Currently Present Has patient been a risk to self within the past 6 months prior to admission? : Yes Suicidal Intent: No Has patient had any suicidal intent within the past 6 months prior to admission? : Yes Is patient at risk for suicide?: Yes Suicidal Plan?: Yes-Currently Present Has patient had any suicidal plan within the past 6 months prior to admission? : Yes Specify Current Suicidal Plan: "Plenty of ways" Access to Means: Yes Specify Access to Suicidal Means: Pt reports "I could do it" What has been your use of drugs/alcohol within the last 12 months?: Pt is abusing alcohol Previous Attempts/Gestures: Yes How many times?: 0 (Unknown) Other Self Harm Risks: None Triggers for Past Attempts: Unknown Intentional Self Injurious Behavior: None Family Suicide History: No Recent stressful life event(s): Conflict (Comment) (Conflict with cousin) Persecutory voices/beliefs?: No Depression: Yes Depression Symptoms: Despondent, Tearfulness, Isolating, Fatigue, Loss of interest in usual pleasures, Feeling worthless/self pity, Feeling angry/irritable Substance abuse history and/or treatment for substance abuse?: Yes Suicide prevention information given to non-admitted patients: Not applicable  Risk to Others within the past 6 months Homicidal Ideation: Yes-Currently Present Does patient have any lifetime risk of violence toward others beyond the six months prior to admission? : No Thoughts of Harm to Others: Yes-Currently Present Comment - Thoughts of Harm to Others: Pt reports thought of hurting cousin Current Homicidal Intent: No Current Homicidal Plan: No Access to Homicidal Means: No Identified Victim: Cousin History of harm to others?: No Assessment of  Violence: None Noted Violent Behavior Description: Pt denies history of violence Does patient have access to weapons?: No Criminal Charges Pending?: No Does patient have a court date: No Is patient on probation?: No  Psychosis Hallucinations: Auditory (Pt cannot describe auditory hallucinations) Delusions: None noted  Mental Status Report Appearance/Hygiene: In scrubs Eye Contact: Poor Motor Activity: Unremarkable Speech: Slurred Level of Consciousness: Drowsy Mood: Irritable Affect: Irritable Anxiety Level: None Thought Processes: Coherent Judgement: Impaired Orientation: Person, Place, Time, Situation, Appropriate for developmental age Obsessive Compulsive Thoughts/Behaviors: None  Cognitive Functioning Concentration: Decreased Memory: Recent Intact, Remote Intact IQ: Average Insight: Poor Impulse Control: Fair Appetite: Good Weight Loss: 0 Weight Gain: 0 Sleep: Decreased Total Hours of Sleep: 4 Vegetative Symptoms: None  ADLScreening [facility] Assessment Services) Patient's cognitive ability adequate to safely complete daily activities?: Yes Patient able to express need for assistance with ADLs?: Yes Independently performs ADLs?: Yes (appropriate for developmental age)  Prior Inpatient Therapy Prior Inpatient Therapy: No Prior Therapy Dates: NA Prior Therapy Facilty/Provider(s): NA Reason for Treatment: NA  Prior Outpatient Therapy Prior Outpatient Therapy: No Prior Therapy Dates: NA Prior Therapy Facilty/Provider(s): NA Reason for Treatment: NA Does patient have an ACCT team?: No Does patient have Intensive In-House Services?  : No Does patient have [facility] services? : No Does patient have P4CC services?: No  ADL Screening (condition at time of admission) Patient's cognitive ability adequate to safely complete daily activities?: Yes Is the patient deaf or  have difficulty hearing?: No Does the patient have difficulty seeing, even when wearing  glasses/contacts?: No Does the patient have difficulty concentrating, remembering, or making decisions?: No Patient able to express need for assistance with ADLs?: Yes Does the patient have difficulty dressing or bathing?: No Independently performs ADLs?: Yes (appropriate for developmental age) Does the patient have difficulty walking or climbing stairs?: No Weakness of Legs: None Weakness of Arms/Hands: None  Home Assistive Devices/Equipment Home Assistive Devices/Equipment: None    Abuse/Neglect Assessment (Assessment to be complete while patient is alone) Physical Abuse: Denies Verbal Abuse: Denies Sexual Abuse: Denies Exploitation of patient/patient's resources: Denies Self-Neglect: Denies     Merchant navy officer (For Healthcare) Does patient have an advance directive?: No Would patient like information on creating an advanced directive?: No - patient declined information    Additional Information 1:1 In Past 12 Months?: No CIRT Risk: No Elopement Risk: No Does patient have medical clearance?: Yes     Disposition: Gave clinical report to Nira Conn, NP who recommends Pt be evaluated by psychiatry this morning when Pt is more sober. Notified Dr. Preston Fleeting of recommendation.  Disposition Initial Assessment Completed for this Encounter: Yes Disposition of Patient: Other dispositions Other disposition(s): Other (Comment)   Pamalee Leyden, [facility], [facility], [facility] Triage Specialist [phone]   Patsy Baltimore, Harlin Rain 02/01/2016 5:10 AM

## 2016-02-01 NOTE — ED Provider Notes (Signed)
[facility] Provider Note   CSN: [id number] Arrival date & time: 01/31/16  2339  By signing my name below, I, Craig Fischer, attest that this documentation has been prepared under the direction and in the presence of Dione Boozeavid Walton Digilio, MD . Electronically Signed: Modena JanskyAlbert Fischer, Scribe. 02/01/2016. 12:12 AM.  History   Chief Complaint Chief Complaint  Patient presents with  . V70.1   The history is provided by the patient and the police. No language interpreter was used.   HPI Comments: Craig Fischer is a 30 y.o. male who presents to the Emergency Department for alcohol intoxication. He reports "he is crazy psychopath but a good man". Reports auditory hallucinations and HI. Denies SI or drug use.  Past Medical History:  Diagnosis Date  . Asthma   . Diabetes mellitus without complication (HCC)   . Hypertension     There are no active problems to display for this patient.   History reviewed. No pertinent surgical history.     Home Medications    Prior to Admission medications   Not on File    Family History No family history on file.  Social History Social History  Substance Use Topics  . Smoking status: Current Every Day Smoker  . Smokeless tobacco: Never Used  . Alcohol use Yes     Allergies   Hydrocodone   Review of Systems Review of Systems  Psychiatric/Behavioral: Positive for behavioral problems and hallucinations (Auditory). Negative for suicidal ideas.  All other systems reviewed and are negative.    Physical Exam Updated Vital Signs BP 126/96 (BP Location: Left Arm)   Pulse (!) 163 Comment: Made nurse aware  Temp 98.9 F (37.2 C) (Oral)   Resp 22   Ht 5\' 8"  (1.727 m)   Wt 268 lb (121.6 kg)   SpO2 96%   BMI 40.75 kg/m   Physical Exam  Constitutional: He is oriented to person, place, and time. He appears well-developed and well-nourished.  HENT:  Head: Normocephalic and atraumatic.  Eyes: EOM are normal. Pupils are equal, round,  and reactive to light.  Neck: Normal range of motion. Neck supple. No JVD present.  Cardiovascular: Normal rate, regular rhythm and normal heart sounds.   No murmur heard. Pulmonary/Chest: Effort normal and breath sounds normal. He has no wheezes. He has no rales. He exhibits no tenderness.  Abdominal: Soft. Bowel sounds are normal. He exhibits no distension and no mass. There is no tenderness.  Musculoskeletal: Normal range of motion. He exhibits no edema.  Lymphadenopathy:    He has no cervical adenopathy.  Neurological: He is alert and oriented to person, place, and time. No cranial nerve deficit. He exhibits normal muscle tone. Coordination normal.  Skin: Skin is warm and dry. No rash noted.  Psychiatric:  Admits to auditory hallucinations with HI. Moderate dysphoria.   Nursing note and vitals reviewed.    ED Treatments / Results  DIAGNOSTIC STUDIES: Oxygen Saturation is 96% on RA, normal by my interpretation.    COORDINATION OF CARE: 12:19 AM- Pt advised of plan for treatment and pt agrees.  Labs (all labs ordered are listed, but only abnormal results are displayed) Labs Reviewed  COMPREHENSIVE METABOLIC PANEL - Abnormal; Notable for the following:       Result Value   Glucose, Bld 260 (*)    Calcium 8.6 (*)    Total Protein 8.3 (*)    All other components within normal limits  ETHANOL - Abnormal; Notable for the following:  Alcohol, Ethyl (B) 255 (*)    All other components within normal limits  CBC WITH DIFFERENTIAL/PLATELET  URINE RAPID DRUG SCREEN, HOSP PERFORMED    EKG  EKG Interpretation  Date/Time:  Saturday February 01 2016 00:00:37 EDT Ventricular Rate:  145 PR Interval:    QRS Duration: 85 QT Interval:  279 QTC Calculation: 434 R Axis:   67 Text Interpretation:  Sinus tachycardia Multiple ventricular premature complexes Repol abnrm suggests ischemia, diffuse leads Baseline wander in lead(s) V4 When compared with ECG of 08/23/2009, HEART RATE has  increased Premature ventricular complexes are now Present REPOLARIZATION ABNORMALITY is now Present - possibly rate-related Confirmed by [facility]  MD, Aryaan Persichetti ([id number]) on 02/01/2016 12:11:44 AM      Procedures Procedures (including critical care time)  Medications Ordered in ED Medications  alum & mag hydroxide-simeth (MAALOX/MYLANTA) 200-200-20 MG/5ML suspension 30 mL (not administered)  ondansetron (ZOFRAN) tablet 4 mg (not administered)  nicotine (NICODERM CQ - dosed in mg/24 hours) patch 21 mg (not administered)  zolpidem (AMBIEN) tablet 5 mg (not administered)  ibuprofen (ADVIL,MOTRIN) tablet 600 mg (not administered)  LORazepam (ATIVAN) tablet 1 mg (not administered)  acetaminophen (TYLENOL) tablet 650 mg (not administered)     Initial Impression / Assessment and Plan / ED Course  I have reviewed the triage vital signs and the nursing notes.  Pertinent labs & imaging results that were available during my care of the patient were reviewed by me and considered in my medical decision making (see chart for details).  Clinical Course   Patient with auditory command hallucinations with homicidal ideation. He also is intoxicated and is unclear what is related to alcohol what is related to underlying psychosis. Old records are reviewed, and he has no relevant past visits. Screening labs to confirm alcohol intoxication. At this point, he is felt to be medically cleared. TTS consultation is been obtained and a how recommended that he be kept in the ED for psychiatric evaluation in the morning.  Final Clinical Impressions(s) / ED Diagnoses   Final diagnoses:  Psychosis, unspecified psychosis type  Homicidal ideation  Auditory hallucination  Alcohol intoxication, uncomplicated (HCC)    New Prescriptions New Prescriptions   No medications on file   I personally performed the services described in this documentation, which was scribed in my presence. The recorded information has been  reviewed and is accurate.       Dione Booze, MD 02/01/16 [phone]

## 2016-02-01 NOTE — Discharge Instructions (Signed)
Follow-up with local mental health facility. Phone number given. No alcohol.

## 2019-11-20 ENCOUNTER — Ambulatory Visit
Admission: EM | Admit: 2019-11-20 | Discharge: 2019-11-20 | Disposition: A | Discharge status: Home / Self Care | Payer: Medicare HMO

## 2019-11-20 ENCOUNTER — Other Ambulatory Visit: Payer: Self-pay

## 2019-11-20 ENCOUNTER — Encounter: Payer: Self-pay | Admitting: Emergency Medicine

## 2019-11-20 DIAGNOSIS — R05 Cough: Secondary | ICD-10-CM

## 2019-11-20 DIAGNOSIS — R059 Cough, unspecified: Secondary | ICD-10-CM

## 2019-11-20 HISTORY — DX: Diabetes mellitus due to underlying condition with diabetic neuropathy, unspecified: E08.40

## 2019-11-20 HISTORY — DX: Disorder of thyroid, unspecified: E07.9

## 2019-11-20 HISTORY — DX: Pure hypercholesterolemia, unspecified: E78.00

## 2019-11-20 MED ORDER — PREDNISONE 10 MG (21) PO TBPK: ORAL_TABLET | ORAL | 0 refills | Status: DC

## 2019-11-20 MED ORDER — BENZONATATE 100 MG PO CAPS: 100.0000 mg | ORAL_CAPSULE | Freq: Three times a day (TID) | ORAL | 0 refills | Status: DC

## 2019-11-20 NOTE — ED Triage Notes (Signed)
Productive cough x 1week.  Reports coughing up green mucous.   Seen at another urgent care x 1 week ago, given abx which pt has finished.

## 2019-11-20 NOTE — Discharge Instructions (Addendum)
Get plenty of rest and push fluids Tessalon Perles prescribed for cough Prednisone was prescribed/take as directed Continue to take antibiotic as prescribed and to completion Use medications daily for symptom relief Use OTC medications like ibuprofen or tylenol as needed fever or pain Call or go to the ED if you have any new or worsening symptoms such as fever, worsening cough, shortness of breath, chest tightness, chest pain, turning blue, changes in mental status, etc..Marland Kitchen

## 2019-11-20 NOTE — ED Provider Notes (Signed)
[facility]   [id number] 11/20/19 Arrival Time: 1011   Chief Complaint  Patient presents with  . Cough     SUBJECTIVE: History from: patient.  Craig Fischer is a 34 y.o. male with smoking history presents to the urgent care for complaint of cough with green mucus for the past 1 week.  Was seen at another urgent care and was prescribed an antibiotic.  States he was not given any other medication to help manage his cough symptoms.  Denies sick exposure to COVID, flu or strep.  Denies recent travel.    Denies aggravating factors.  Denies previous symptoms in the past.   Denies fever, chills, fatigue, sinus pain, rhinorrhea, sore throat, SOB, wheezing, chest pain, nausea, changes in bowel or bladder habits.      ROS: As per HPI.  All other pertinent ROS negative.       Past Medical History:  Diagnosis Date  . Asthma   . 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 Diabetes mellitus without complication (HCC)   . High cholesterol   . Hypertension   . Thyroid disease    Past Surgical History:  Procedure Laterality Date  . goiter     removal   Allergies  Allergen Reactions  . Hydrocodone Other (See Comments)    Panic attack   No current facility-administered medications on file prior to encounter.   Current Outpatient Medications on File Prior to Encounter  Medication Sig Dispense Refill  . gabapentin (NEURONTIN) 100 MG capsule Take 100 mg by mouth 3 (three) times daily.    Marland Kitchen glipiZIDE (GLUCOTROL XL) 2.5 MG 24 hr tablet Take 2 mg by mouth daily with breakfast.    . glyBURIDE (DIABETA) 2.5 MG tablet Take 2.5 mg by mouth daily with breakfast.    . levothyroxine (SYNTHROID, LEVOTHROID) 300 MCG tablet Take 300 mcg by mouth daily before breakfast.    . pravastatin (PRAVACHOL) 40 MG tablet Take 40 mg by mouth daily.    . vitamin B-12 (CYANOCOBALAMIN) 500 MCG tablet Take 500 mcg by mouth daily.    . metFORMIN (GLUCOPHAGE) 1000 MG tablet  Take 1,000 mg by mouth 2 (two) times daily with a meal.      Social History   Socioeconomic History  . Marital status: Single    Spouse name: Not on file  . Number of children: Not on file  . Years of education: Not on file  . Highest education level: Not on file  Occupational History  . Not on file  Tobacco Use  . Smoking status: Current Every Day Smoker    Packs/day: 0.50    Types: Cigarettes  . Smokeless tobacco: Never Used  Substance and Sexual Activity  . Alcohol use: Not Currently  . Drug use: No  . Sexual activity: Not on file  Other Topics Concern  . Not on file  Social History Narrative  . Not on file   Social Determinants of Health   Financial Resource Strain:   . Difficulty of Paying Living Expenses:   Food Insecurity:   . Worried About Programme researcher, broadcasting/film/video in the Last Year:   . Barista in the Last Year:   Transportation Needs:   . Freight forwarder (Medical):   Marland Kitchen Lack of Transportation (Non-Medical):   Physical Activity:   . Days of Exercise per Week:   . Minutes of Exercise per Session:   Stress:   . Feeling of  Stress :   Social Connections:   . Frequency of Communication with Friends and Family:   . Frequency of Social Gatherings with Friends and Family:   . Attends Religious Services:   . Active Member of Clubs or Organizations:   . Attends Banker Meetings:   Marland Kitchen Marital Status:   Intimate Partner Violence:   . Fear of Current or Ex-Partner:   . Emotionally Abused:   Marland Kitchen Physically Abused:   . Sexually Abused:    No family history on file.  OBJECTIVE:  Vitals:   11/20/19 1021 11/20/19 1026  BP:  123/85  Pulse:  86  Resp:  19  Temp:  98.3 F (36.8 C)  TempSrc:  Oral  SpO2:  97%  Weight: 250 lb (113.4 kg)   Height: 5\' 8"  (1.727 m)      General appearance: alert; appears fatigued, but nontoxic; speaking in full sentences and tolerating own secretions HEENT: NCAT; Ears: EACs clear, TMs pearly gray; Eyes: PERRL.   EOM grossly intact. Sinuses: nontender; Nose: nares patent without rhinorrhea, Throat: oropharynx clear, tonsils non erythematous or enlarged, uvula midline  Neck: supple without LAD Lungs: unlabored respirations, symmetrical air entry; cough: moderate; no respiratory distress; CTAB Heart: regular rate and rhythm.  Radial pulses 2+ symmetrical bilaterally Skin: warm and dry Psychological: alert and cooperative; normal mood and affect  LABS:  No results found for this or any previous visit (from the past 24 hour(s)).   ASSESSMENT & PLAN:  1. Cough     Meds ordered this encounter  Medications  . benzonatate (TESSALON) 100 MG capsule    Sig: Take 1 capsule (100 mg total) by mouth every 8 (eight) hours.    Dispense:  30 capsule    Refill:  0  . predniSONE (STERAPRED UNI-PAK 21 TAB) 10 MG (21) TBPK tablet    Sig: Take 6 tabs by mouth daily  for 1 days, then 5 tabs for 1days, then 4 tabs for 1 days, then 3 tabs for 1 days, 2 tabs for 1 days, then 1 tab by mouth daily for 1 days    Dispense:  21 tablet    Refill:  0    Discharge instructions Get plenty of rest and push fluids Tessalon Perles prescribed for cough Prednisone was prescribed/take as directed Continue to take antibiotic as prescribed and to completion Use medications daily for symptom relief Use OTC medications like ibuprofen or tylenol as needed fever or pain Call or go to the ED if you have any new or worsening symptoms such as fever, worsening cough, shortness of breath, chest tightness, chest pain, turning blue, changes in mental status, etc...   Reviewed expectations re: course of current medical issues. Questions answered. Outlined signs and symptoms indicating need for more acute intervention. Patient verbalized understanding. After Visit Summary given.      Note: This document was prepared using Dragon voice recognition software and may include unintentional dictation errors.    ,  FNP 11/20/19 1039

## 2020-02-13 ENCOUNTER — Encounter: Payer: Self-pay | Admitting: Emergency Medicine

## 2020-02-13 ENCOUNTER — Ambulatory Visit
Admission: EM | Admit: 2020-02-13 | Discharge: 2020-02-13 | Disposition: A | Discharge status: Home / Self Care | Payer: Medicare HMO | Attending: Emergency Medicine | Admitting: Emergency Medicine

## 2020-02-13 ENCOUNTER — Other Ambulatory Visit: Payer: Self-pay

## 2020-02-13 DIAGNOSIS — Z1152 Encounter for screening for COVID-19: Secondary | ICD-10-CM

## 2020-02-13 DIAGNOSIS — J069 Acute upper respiratory infection, unspecified: Secondary | ICD-10-CM | POA: Diagnosis not present

## 2020-02-13 MED ORDER — FLUTICASONE PROPIONATE 50 MCG/ACT NA SUSP: 1.0000 | Freq: Every day | NASAL | 0 refills | Status: DC

## 2020-02-13 MED ORDER — DEXAMETHASONE 4 MG PO TABS: 4.0000 mg | ORAL_TABLET | Freq: Every day | ORAL | 0 refills | Status: AC

## 2020-02-13 MED ORDER — CETIRIZINE HCL 10 MG PO TABS: 10.0000 mg | ORAL_TABLET | Freq: Every day | ORAL | 0 refills | Status: DC

## 2020-02-13 MED ORDER — BENZONATATE 100 MG PO CAPS: 100.0000 mg | ORAL_CAPSULE | Freq: Three times a day (TID) | ORAL | 0 refills | Status: DC

## 2020-02-13 NOTE — Discharge Instructions (Signed)

## 2020-02-13 NOTE — ED Triage Notes (Signed)
Cough and congestion x 3 days

## 2020-02-13 NOTE — ED Provider Notes (Signed)
[facility]   [id number] 02/13/20 Arrival Time: 1440   CC: COVID symptoms  SUBJECTIVE: History from: patient.  Craig Fischer is a 34 y.o. male who presents with abrupt onset of nasal congestion, and cough for the past 3 days.  Denies sick exposure to COVID, flu or strep.  Denies recent travel.  Has tried OTC medication without relief.  Denies aggravating factors.  Denies previous symptoms in the past.   Denies fever, chills, fatigue, sinus pain, rhinorrhea, sore throat, SOB, wheezing, chest pain, nausea, changes in bowel or bladder habits.     ROS: As per HPI.  All other pertinent ROS negative.     Past Medical History:  Diagnosis Date  . Asthma   . 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 Diabetes mellitus without complication (HCC)   . High cholesterol   . Hypertension   . Thyroid disease    Past Surgical History:  Procedure Laterality Date  . goiter     removal   Allergies  Allergen Reactions  . Hydrocodone Other (See Comments)    Panic attack   No current facility-administered medications on file prior to encounter.   Current Outpatient Medications on File Prior to Encounter  Medication Sig Dispense Refill  . gabapentin (NEURONTIN) 100 MG capsule Take 100 mg by mouth 3 (three) times daily.    Marland Kitchen glipiZIDE (GLUCOTROL XL) 2.5 MG 24 hr tablet Take 2 mg by mouth daily with breakfast.    . glyBURIDE (DIABETA) 2.5 MG tablet Take 2.5 mg by mouth daily with breakfast.    . levothyroxine (SYNTHROID, LEVOTHROID) 300 MCG tablet Take 300 mcg by mouth daily before breakfast.    . metFORMIN (GLUCOPHAGE) 1000 MG tablet Take 1,000 mg by mouth 2 (two) times daily with a meal.     . pravastatin (PRAVACHOL) 40 MG tablet Take 40 mg by mouth daily.    . predniSONE (STERAPRED UNI-PAK 21 TAB) 10 MG (21) TBPK tablet Take 6 tabs by mouth daily  for 1 days, then 5 tabs for 1days, then 4 tabs for 1 days, then 3 tabs for 1 days, 2 tabs for 1 days, then 1  tab by mouth daily for 1 days 21 tablet 0  . vitamin B-12 (CYANOCOBALAMIN) 500 MCG tablet Take 500 mcg by mouth daily.     Social History   Socioeconomic History  . Marital status: Single    Spouse name: Not on file  . Number of children: Not on file  . Years of education: Not on file  . Highest education level: Not on file  Occupational History  . Not on file  Tobacco Use  . Smoking status: Current Every Day Smoker    Packs/day: 0.50    Types: Cigarettes  . Smokeless tobacco: Never Used  Substance and Sexual Activity  . Alcohol use: Not Currently  . Drug use: No  . Sexual activity: Not on file  Other Topics Concern  . Not on file  Social History Narrative  . Not on file   Social Determinants of Health   Financial Resource Strain:   . Difficulty of Paying Living Expenses: Not on file  Food Insecurity:   . Worried About Programme researcher, broadcasting/film/video in the Last Year: Not on file  . Ran Out of Food in the Last Year: Not on file  Transportation Needs:   . Lack of Transportation (Medical): Not on file  . Lack of Transportation (Non-Medical): Not on file  Physical Activity:   . Days of Exercise per Week: Not on file  . Minutes of Exercise per Session: Not on file  Stress:   . Feeling of Stress : Not on file  Social Connections:   . Frequency of Communication with Friends and Family: Not on file  . Frequency of Social Gatherings with Friends and Family: Not on file  . Attends Religious Services: Not on file  . Active Member of Clubs or Organizations: Not on file  . Attends Banker Meetings: Not on file  . Marital Status: Not on file  Intimate Partner Violence:   . Fear of Current or Ex-Partner: Not on file  . Emotionally Abused: Not on file  . Physically Abused: Not on file  . Sexually Abused: Not on file   No family history on file.  OBJECTIVE:  Vitals:   02/13/20 1515  BP: (!) 135/94  Pulse: 98  Resp: 18  Temp: 98.7 F (37.1 C)  TempSrc: Oral  SpO2:  98%  Weight: 241 lb (109.3 kg)  Height: 5\' 8"  (1.727 m)     General appearance: alert; appears fatigued, but nontoxic; speaking in full sentences and tolerating own secretions HEENT: NCAT; Ears: EACs clear, TMs pearly gray; Eyes: PERRL.  EOM grossly intact. Sinuses: nontender; Nose: nares patent without rhinorrhea, Throat: oropharynx clear, tonsils non erythematous or enlarged, uvula midline  Neck: supple without LAD Lungs: unlabored respirations, symmetrical air entry; cough: moderate; no respiratory distress; CTAB Heart: regular rate and rhythm.  Radial pulses 2+ symmetrical bilaterally Skin: warm and dry Psychological: alert and cooperative; normal mood and affect  LABS:  No results found for this or any previous visit (from the past 24 hour(s)).   ASSESSMENT & PLAN:  1. URI with cough and congestion   2. Encounter for screening for COVID-19     Meds ordered this encounter  Medications  . fluticasone (FLONASE) 50 MCG/ACT nasal spray    Sig: Place 1 spray into both nostrils daily for 14 days.    Dispense:  16 g    Refill:  0  . cetirizine (ZYRTEC ALLERGY) 10 MG tablet    Sig: Take 1 tablet (10 mg total) by mouth daily.    Dispense:  30 tablet    Refill:  0  . benzonatate (TESSALON) 100 MG capsule    Sig: Take 1 capsule (100 mg total) by mouth every 8 (eight) hours.    Dispense:  30 capsule    Refill:  0  . dexamethasone (DECADRON) 4 MG tablet    Sig: Take 1 tablet (4 mg total) by mouth daily for 7 days.    Dispense:  7 tablet    Refill:  0    Discharge instructions  COVID testing ordered.  It will take between 2-7 days for test results.  Someone will contact you regarding abnormal results.    In the meantime: You should remain isolated in your home for 10 days from symptom onset AND greater than 24 hours after symptoms resolution (absence of fever without the use of fever-reducing medication and improvement in respiratory symptoms), whichever is longer Get plenty of  rest and push fluids Tessalon Perles prescribed for cough Zyrtec for nasal congestion, runny nose, and/or sore throat Flonase for nasal congestion and runny nose Decadron was prescribed Use medications daily for symptom relief Use OTC medications like ibuprofen or tylenol as needed fever or pain Call or go to the ED if you have any new or worsening symptoms such  as fever, worsening cough, shortness of breath, chest tightness, chest pain, turning blue, changes in mental status, etc...   Reviewed expectations re: course of current medical issues. Questions answered. Outlined signs and symptoms indicating need for more acute intervention. Patient verbalized understanding. After Visit Summary given.         Durward Parcel, FNP 02/13/20 [phone]

## 2020-02-14 LAB — NOVEL CORONAVIRUS, NAA: SARS-CoV-2, NAA: NOT DETECTED

## 2020-02-14 LAB — SARS-COV-2, NAA 2 DAY TAT

## 2021-12-06 ENCOUNTER — Emergency Department (HOSPITAL_COMMUNITY)
Admission: EM | Admit: 2021-12-06 | Discharge: 2021-12-06 | Disposition: A | Discharge status: Home / Self Care | Payer: Medicare Other | Attending: Emergency Medicine | Admitting: Emergency Medicine

## 2021-12-06 ENCOUNTER — Other Ambulatory Visit: Payer: Self-pay

## 2021-12-06 ENCOUNTER — Encounter (HOSPITAL_COMMUNITY): Payer: Self-pay

## 2021-12-06 DIAGNOSIS — R000 Tachycardia, unspecified: Secondary | ICD-10-CM | POA: Insufficient documentation

## 2021-12-06 DIAGNOSIS — R739 Hyperglycemia, unspecified: Secondary | ICD-10-CM

## 2021-12-06 DIAGNOSIS — Z7984 Long term (current) use of oral hypoglycemic drugs: Secondary | ICD-10-CM | POA: Diagnosis not present

## 2021-12-06 DIAGNOSIS — Z87891 Personal history of nicotine dependence: Secondary | ICD-10-CM | POA: Insufficient documentation

## 2021-12-06 DIAGNOSIS — E1165 Type 2 diabetes mellitus with hyperglycemia: Secondary | ICD-10-CM | POA: Insufficient documentation

## 2021-12-06 LAB — URINALYSIS, ROUTINE W REFLEX MICROSCOPIC
Bacteria, UA: NONE SEEN
Bilirubin Urine: NEGATIVE
Glucose, UA: >=500 mg/dL — AB
Hgb urine dipstick: NEGATIVE
Ketones, ur: 5 mg/dL — AB
Leukocytes,Ua: NEGATIVE
Nitrite: NEGATIVE
Protein, ur: NEGATIVE mg/dL
Specific Gravity, Urine: 1.026 (ref 1.005–1.030)
pH: 5 (ref 5.0–8.0)

## 2021-12-06 LAB — CBC WITH DIFFERENTIAL/PLATELET
Abs Immature Granulocytes: 0.03 10*3/uL (ref 0.00–0.07)
Basophils Absolute: 0 10*3/uL (ref 0.0–0.1)
Basophils Relative: 1 %
Eosinophils Absolute: 0.1 10*3/uL (ref 0.0–0.5)
Eosinophils Relative: 3 %
HCT: 40.9 % (ref 39.0–52.0)
Hemoglobin: 14.2 g/dL (ref 13.0–17.0)
Immature Granulocytes: 1 %
Lymphocytes Relative: 40 %
Lymphs Abs: 2.1 10*3/uL (ref 0.7–4.0)
MCH: 30.6 pg (ref 26.0–34.0)
MCHC: 34.7 g/dL (ref 30.0–36.0)
MCV: 88.1 fL (ref 80.0–100.0)
Monocytes Absolute: 0.4 10*3/uL (ref 0.1–1.0)
Monocytes Relative: 8 %
Neutro Abs: 2.5 10*3/uL (ref 1.7–7.7)
Neutrophils Relative %: 47 %
Platelets: 248 10*3/uL (ref 150–400)
RBC: 4.64 MIL/uL (ref 4.22–5.81)
RDW: 11.8 % (ref 11.5–15.5)
WBC: 5.2 10*3/uL (ref 4.0–10.5)
nRBC: 0 % (ref 0.0–0.2)

## 2021-12-06 LAB — BASIC METABOLIC PANEL
Anion gap: 6 (ref 5–15)
BUN: 11 mg/dL (ref 6–20)
CO2: 25 mmol/L (ref 22–32)
Calcium: 9 mg/dL (ref 8.9–10.3)
Chloride: 103 mmol/L (ref 98–111)
Creatinine, Ser: 0.86 mg/dL (ref 0.61–1.24)
GFR, Estimated: >60 mL/min (ref >60)
Glucose, Bld: 250 mg/dL — ABNORMAL HIGH (ref 70–99)
Potassium: 4.3 mmol/L (ref 3.5–5.1)
Sodium: 134 mmol/L — ABNORMAL LOW (ref 135–145)

## 2021-12-06 LAB — CBG MONITORING, ED
Glucose-Capillary: 246 mg/dL — ABNORMAL HIGH (ref 70–99)
Glucose-Capillary: 205 mg/dL — ABNORMAL HIGH (ref 70–99)

## 2021-12-06 MED ORDER — LACTATED RINGERS IV BOLUS
1000.0000 mL | Freq: Once | INTRAVENOUS | Status: AC
  Administered 2021-12-06: 1000 mL via INTRAVENOUS

## 2021-12-06 NOTE — ED Provider Notes (Signed)
[facility] EMERGENCY DEPARTMENT Provider Note   CSN: [id number] Arrival date & time: 12/06/21  1111     History  Chief Complaint  Patient presents with   Hyperglycemia    Craig Fischer is a 36 y.o. male with history of type 2 diabetes who presents to the emergency department for evaluation of hyperglycemia since yesterday.  Patient states that he manages his glucose with glipizide and glyburide with sugars normally running in the 120s to 150s.  Yesterday, he noted blood sugars in the upper 300s which have come down to about the mid 250s and has stayed there throughout today.  He states that he used to be on metformin 1000 mg twice daily, however he was switched 1 year ago due to the GI side effects, however he wishes to go back to that medication as he believes it worked better for managing his sugar.  Associated symptoms at this time include intermittent mild headaches and feeling "dry".  Currently, he denies blurred vision, fever, chest pain, abdominal pain, nausea, vomiting, diarrhea, dysuria, hematuria, PU/PD cough and congestion.  Hyperglycemia Associated symptoms: no abdominal pain, no dysuria, no fever, no increased thirst, no nausea, no polyuria, no shortness of breath and no vomiting        Home Medications Prior to Admission medications   Medication Sig Start Date End Date Taking? Authorizing Provider  atorvastatin (LIPITOR) 80 MG tablet Take 80 mg by mouth daily. 10/02/21  Yes [provider]  fluticasone (FLONASE) 50 MCG/ACT nasal spray Place 1 spray into both nostrils daily for 14 days. 02/13/20 12/06/21 Yes Avegno, Zachery Dakins, FNP  glimepiride (AMARYL) 2 MG tablet Take 2 mg by mouth every morning. 10/02/21  Yes [provider]  levothyroxine (SYNTHROID, LEVOTHROID) 300 MCG tablet Take 300 mcg by mouth daily before breakfast.   Yes [provider]  vitamin B-12 (CYANOCOBALAMIN) 500 MCG tablet Take 500 mcg by mouth daily.   Yes [provider]      Allergies    Hydrocodone and Robitussin dm max day-night    Review of Systems   Review of Systems  Constitutional:  Negative for fever.  Respiratory:  Negative for shortness of breath.   Gastrointestinal:  Negative for abdominal pain, diarrhea, nausea and vomiting.  Endocrine: Negative for polydipsia and polyuria.  Genitourinary:  Negative for dysuria and hematuria.  Neurological:  Positive for headaches.    Physical Exam Updated Vital Signs BP 110/84   Pulse (!) 103   Temp 97.8 F (36.6 C) (Oral)   Resp (!) 23   Ht 5\' 8"  (1.727 m)   Wt 104.3 kg   SpO2 100%   BMI 34.97 kg/m  Physical Exam Vitals and nursing note reviewed.  Constitutional:      General: He is not in acute distress.    Appearance: He is not ill-appearing.  HENT:     Head: Atraumatic.  Eyes:     Conjunctiva/sclera: Conjunctivae normal.  Cardiovascular:     Rate and Rhythm: Regular rhythm. Tachycardia present.     Pulses: Normal pulses.     Heart sounds: No murmur heard. Pulmonary:     Effort: Pulmonary effort is normal. No respiratory distress.     Breath sounds: Normal breath sounds.  Abdominal:     General: Abdomen is flat. There is no distension.     Palpations: Abdomen is soft.     Tenderness: There is no abdominal tenderness.  Musculoskeletal:        General: Normal  range of motion.     Cervical back: Normal range of motion.  Skin:    General: Skin is warm and dry.     Capillary Refill: Capillary refill takes less than 2 seconds.  Neurological:     General: No focal deficit present.     Mental Status: He is alert.  Psychiatric:        Mood and Affect: Mood normal.     ED Results / Procedures / Treatments   Labs (all labs ordered are listed, but only abnormal results are displayed) Labs Reviewed  BASIC METABOLIC PANEL - Abnormal; Notable for the following components:      Result Value   Sodium 134 (*)    Glucose, Bld 250 (*)    All other components within normal limits   URINALYSIS, ROUTINE W REFLEX MICROSCOPIC - Abnormal; Notable for the following components:   Glucose, UA >=500 (*)    Ketones, ur 5 (*)    All other components within normal limits  CBG MONITORING, ED - Abnormal; Notable for the following components:   Glucose-Capillary 246 (*)    All other components within normal limits  CBG MONITORING, ED - Abnormal; Notable for the following components:   Glucose-Capillary 205 (*)    All other components within normal limits  CBC WITH DIFFERENTIAL/PLATELET    EKG None  Radiology No results found.  Procedures Procedures    Medications Ordered in ED Medications  lactated ringers bolus 1,000 mL (1,000 mLs Intravenous New Bag/Given 12/06/21 1216)    ED Course/ Medical Decision Making/ A&P                           Medical Decision Making Amount and/or Complexity of Data Reviewed Labs: ordered.   Social determinants of health:  Social History   Socioeconomic History   Marital status: Single    Spouse name: Not on file   Number of children: Not on file   Years of education: Not on file   Highest education level: Not on file  Occupational History   Not on file  Tobacco Use   Smoking status: Former    Packs/day: 0.50    Types: Cigarettes   Smokeless tobacco: Never  Substance and Sexual Activity   Alcohol use: Not Currently   Drug use: No   Sexual activity: Not on file  Other Topics Concern   Not on file  Social History Narrative   Not on file   Social Determinants of Health   Financial Resource Strain: Not on file  Food Insecurity: Not on file  Transportation Needs: Not on file  Physical Activity: Not on file  Stress: Not on file  Social Connections: Not on file  Intimate Partner Violence: Not on file     Initial impression:  This patient presents to the ED for concern of hyperglycemia, this involves an extensive number of treatment options, and is a complaint that carries with it a high risk of complications  and morbidity.   Differentials include hyperglycemia, DKA, HHS.   Comorbidities affecting care:  DM  Additional history obtained: wife  Lab Tests  I Ordered, reviewed, and interpreted labs and EKG.  The pertinent results include:  Glucose noted in urine BG 250, no evidence of DKA, no leukocytosis   Cardiac Monitoring:  The patient was maintained on a cardiac monitor.  I personally viewed and interpreted the cardiac monitored which showed an underlying rhythm of: Sinus tachycardia  Medicines ordered and prescription drug management:  I ordered medication including: 1 L LR bolus   Reevaluation of the patient after these medicines showed that the patient improved I have reviewed the patients home medicines and have made adjustments as needed    ED Course/Re-evaluation: Patient presents in no acute distress and is nontoxic-appearing.  His vitals were significant for tachycardia to about 115, but otherwise was normotensive, afebrile and satting well on room air.  Physical exam was benign with no acute findings.  Mucous membranes were moist.  Currently, symptoms are not consistent with a diabetic emergency but I will check BMP, CBC and UA out of abundance of caution.  Given his tachycardia, will give 1 L of fluids which will hopefully normalize heart rate and improve blood sugar. On reevaluation, patient's blood sugar had improved down to 205.  His labs returned without any concerning findings and no evidence of DKA or HHS.  Given that his labs are reassuring and patient has no other symptoms, he is stable for discharge and follow-up with his PCP.  He does endorse that his diet is largely comprised of Ramen noodles and bread.  Recommended that he decrease his carb intake and increase his lean protein and vegetable intake.  I also recommend that he call his PCP on Monday to discuss whether or not he requires medication changes.  Recommend increase fluid intake.  We discussed return  precautions and when to return to the emergency department.  Patient expresses understanding and is amenable to plan.  Disposition:  After consideration of the diagnostic results, physical exam, history and the patients response to treatment feel that the patent would benefit from discharge.   Hyperglycemia: Plan and management as described above. Discharged home in good condition.   Final Clinical Impression(s) / ED Diagnoses Final diagnoses:  Hyperglycemia    Rx / DC Orders ED Discharge Orders     None         Delight Ovens 12/06/21 1333    Eber Hong, MD 12/08/21 1131

## 2021-12-06 NOTE — ED Triage Notes (Signed)
Pt reports up and down blood sugar. Pt reports last night BG being in the 300's and this morning being in the 200's. Pt states he normally runs in the 150's/  BG in triage 243

## 2021-12-06 NOTE — ED Notes (Signed)
Patient reports he has been diabetic 2 since 2011.  Patient reports he had recent medication change from Metformin to glipizide and it isn't controlling his blood sugar.  Patient reports blood sugar at home today 255, patient states he feels ok except for a slight headache

## 2021-12-06 NOTE — Discharge Instructions (Signed)
Your labs today were very reassuring.  All your blood sugar is definitely elevated, there is no evidence of any metabolic emergencies such as DKA.  Continue to drink plenty of water at home which will help flush out additional sugars and take your medications as prescribed.  I would also make adjustments to your diet and decrease your carb intake and your sugar intake and increase lean proteins and fruits and vegetables.  Please call your doctor on Monday if you continue to have elevated sugars and discuss any necessary medication changes.

## 2022-04-13 ENCOUNTER — Ambulatory Visit
Admission: EM | Admit: 2022-04-13 | Discharge: 2022-04-13 | Disposition: A | Discharge status: Home / Self Care | Payer: Medicare Other | Attending: Nurse Practitioner | Admitting: Nurse Practitioner

## 2022-04-13 DIAGNOSIS — B349 Viral infection, unspecified: Secondary | ICD-10-CM | POA: Diagnosis not present

## 2022-04-13 DIAGNOSIS — Z1152 Encounter for screening for COVID-19: Secondary | ICD-10-CM | POA: Diagnosis not present

## 2022-04-13 LAB — RESP PANEL BY RT-PCR (FLU A&B, COVID) ARPGX2
Influenza A by PCR: NEGATIVE
Influenza B by PCR: NEGATIVE
SARS Coronavirus 2 by RT PCR: POSITIVE — AB

## 2022-04-13 MED ORDER — FLUTICASONE PROPIONATE 50 MCG/ACT NA SUSP: 2.0000 | Freq: Every day | NASAL | 0 refills | Status: AC

## 2022-04-13 MED ORDER — BENZONATATE 100 MG PO CAPS: 100.0000 mg | ORAL_CAPSULE | Freq: Three times a day (TID) | ORAL | 0 refills | Status: AC | PRN

## 2022-04-13 NOTE — Discharge Instructions (Addendum)
COVID/flu is pending at this time. You will be contacted if the results of the test are positive. As discussed, you are a candidate to receive molnupiravir as an antiviral therapy if the COVID results are positive.  Take medication as prescribed. Increase fluids and allow for plenty of rest. Recommend Tylenol or ibuprofen as needed for pain, fever, or general discomfort. Warm salt water gargles 3-4 times daily to help with throat pain or discomfort. Recommend using a humidifier at bedtime during sleep to help with cough and nasal congestion. Sleep elevated on 2 pillows while symptoms persist. As discussed, if symptoms continue to persist after 10 to 14 days, or worsen before that time, follow-up with your primary care physician or in this clinic.  If symptoms fail to improve over the next 7 to 10 days or suddenly worsen, follow-up in this clinic or with your PCP.

## 2022-04-13 NOTE — ED Triage Notes (Signed)
Cough, headache, runny nose that started yesterday. Not taking any OTC medication.

## 2022-04-13 NOTE — ED Provider Notes (Signed)
[facility]    CSN: [id number] Arrival date & time: 04/13/22  1821      History   Chief Complaint Chief Complaint  Patient presents with   Cough   Nasal Congestion   Headache    HPI Craig Fischer is a 36 y.o. male.   The history is provided by the patient.   The patient presents with a 1 day history of cough, headache, nasal congestion, and runny nose.  Patient denies fever, chills, ear pain, wheezing, shortness of breath, difficulty breathing, or GI symptoms.  Patient denies any obvious known sick contacts.  He reports he has not taken any medication for his symptoms.  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 Authorizing Provider  albuterol (VENTOLIN HFA) 108 (90 Base) MCG/ACT inhaler Inhale 2 puffs into the lungs every 6 (six) hours as needed for shortness of breath. 02/07/22  Yes [provider]  atorvastatin (LIPITOR) 80 MG tablet Take 80 mg by mouth daily. 10/02/21  Yes [provider]  benzonatate (TESSALON PERLES) 100 MG capsule Take 1 capsule (100 mg total) by mouth 3 (three) times daily as needed for up to 10 days for cough. 04/13/22 04/23/22 Yes Fabion Gatson-Warren, Alda Lea, NP  FARXIGA 10 MG TABS tablet Take 10 mg by mouth daily. 04/13/22  Yes [provider]  fluticasone (FLONASE) 50 MCG/ACT nasal spray Place 2 sprays into both nostrils daily. 04/13/22  Yes Kile Kabler-Warren, Alda Lea, NP  levothyroxine (SYNTHROID, LEVOTHROID) 300 MCG tablet Take 300 mcg by mouth daily before  breakfast.   Yes [provider]  metFORMIN (GLUCOPHAGE-XR) 500 MG 24 hr tablet Take 500 mg by mouth daily with breakfast. 12/08/21  Yes [provider]  vitamin B-12 (CYANOCOBALAMIN) 500 MCG tablet Take 500 mcg by mouth daily.   Yes [provider]  glimepiride (AMARYL) 2 MG tablet Take 2 mg by mouth every morning. 10/02/21   [provider]    Family History History reviewed. No pertinent family history.  Social History Social History   Tobacco Use   Smoking status: Former    Packs/day: 0.50    Types: Cigarettes   Smokeless tobacco: Never  Substance Use Topics   Alcohol use: Not Currently   Drug use: No     Allergies   Hydrocodone and Robitussin dm max day-night   Review of Systems Review of Systems Per HPI  Physical Exam Triage Vital Signs ED Triage Vitals  Enc Vitals Group     BP 04/13/22 1916 135/86     Pulse Rate 04/13/22 1916 (!) 112     Resp 04/13/22 1916 16     Temp 04/13/22 1916 99.6 F (37.6 C)     Temp Source 04/13/22 1916 Oral     SpO2 04/13/22 1916 97 %     Weight --      Height --      Head Circumference --  Peak Flow --      Pain Score 04/13/22 1917 0     Pain Loc --      Pain Edu? --      Excl. in [place]? --    No data found.  Updated Vital Signs BP 135/86 (BP Location: Right Arm)   Pulse (!) 112   Temp 99.6 F (37.6 C) (Oral)   Resp 16   SpO2 97%   Visual Acuity Right Eye Distance:   Left Eye Distance:   Bilateral Distance:    Right Eye Near:   Left Eye Near:    Bilateral Near:     Physical Exam Vitals and nursing note reviewed.  Constitutional:      General: He is not in acute distress.    Appearance: He is well-developed.  HENT:     Head: Normocephalic.     Right Ear: Tympanic membrane, ear canal and external ear normal.     Left Ear: Tympanic membrane, ear canal and external ear normal.     Nose: Congestion and rhinorrhea present.     Mouth/Throat:     Mouth: Mucous membranes are  moist.  Eyes:     Extraocular Movements: Extraocular movements intact.     Pupils: Pupils are equal, round, and reactive to light.  Cardiovascular:     Rate and Rhythm: Regular rhythm.     Heart sounds: Normal heart sounds.  Pulmonary:     Effort: Pulmonary effort is normal. No respiratory distress.     Breath sounds: Normal breath sounds. No stridor. No wheezing, rhonchi or rales.  Abdominal:     General: Bowel sounds are normal.     Palpations: Abdomen is soft.     Tenderness: There is no abdominal tenderness.  Musculoskeletal:     Cervical back: Normal range of motion.  Lymphadenopathy:     Cervical: No cervical adenopathy.  Skin:    General: Skin is warm and dry.  Neurological:     General: No focal deficit present.     Mental Status: He is alert and oriented to person, place, and time.     GCS: GCS eye subscore is 4. GCS verbal subscore is 5. GCS motor subscore is 6.  Psychiatric:        Mood and Affect: Mood normal.        Speech: Speech normal.        Behavior: Behavior normal.        Thought Content: Thought content normal.        Judgment: Judgment normal.      UC Treatments / Results  Labs (all labs ordered are listed, but only abnormal results are displayed) Labs Reviewed  RESP PANEL BY RT-PCR (FLU A&B, COVID) [id number]    EKG   Radiology No results found.  Procedures Procedures (including critical care time)  Medications Ordered in UC Medications - No data to display  Initial Impression / Assessment and Plan / UC Course  I have reviewed the triage vital signs and the nursing notes.  Pertinent labs & imaging results that were available during my care of the patient were reviewed by me and considered in my medical decision making (see chart for details).  Patient presents for complaints of upper respiratory symptoms that been present over the past 24 hours.  On exam, patient's vital signs show he is tachycardic, but otherwise stable, and is in no acute  distress.  COVID/flu test is pending.  Symptoms are consistent with a viral upper  respiratory illness at this time.  Patient would like to receive antiviral therapy if the COVID test is positive.  Patient was prescribed benzonatate 100 mg for the cough and fluticasone 50 mcg nasal spray for his nasal congestion.  Reported care recommendations were provided to the patient to include increasing fluids, allowing for plenty of rest, and use of Tylenol or ibuprofen for pain or discomfort.  Patient verbalizes understanding.  All questions were answered.  Patient is stable for discharge.  Work note was provided. Final Clinical Impressions(s) / UC Diagnoses   Final diagnoses:  Viral illness  Encounter for screening for COVID-19     Discharge Instructions      COVID/flu is pending at this time. You will be contacted if the results of the test are positive. As discussed, you are a candidate to receive molnupiravir as an antiviral therapy if the COVID results are positive.  Take medication as prescribed. Increase fluids and allow for plenty of rest. Recommend Tylenol or ibuprofen as needed for pain, fever, or general discomfort. Warm salt water gargles 3-4 times daily to help with throat pain or discomfort. Recommend using a humidifier at bedtime during sleep to help with cough and nasal congestion. Sleep elevated on 2 pillows while symptoms persist. As discussed, if symptoms continue to persist after 10 to 14 days, or worsen before that time, follow-up with your primary care physician or in this clinic.  If symptoms fail to improve over the next 7 to 10 days or suddenly worsen, follow-up in this clinic or with your PCP.      ED Prescriptions     Medication Sig Dispense Auth. Provider   benzonatate (TESSALON PERLES) 100 MG capsule Take 1 capsule (100 mg total) by mouth 3 (three) times daily as needed for up to 10 days for cough. 30 capsule Christia Coaxum-Warren, Sadie Haber, NP   fluticasone (FLONASE) 50  MCG/ACT nasal spray Place 2 sprays into both nostrils daily. 16 g Myrta Mercer-Warren, Sadie Haber, NP      PDMP not reviewed this encounter.   Abran Cantor, NP 04/13/22 1949

## 2022-04-14 ENCOUNTER — Telehealth (HOSPITAL_COMMUNITY): Payer: Self-pay | Admitting: Emergency Medicine

## 2022-04-14 ENCOUNTER — Telehealth: Payer: Self-pay

## 2022-04-14 MED ORDER — MOLNUPIRAVIR EUA 200MG CAPSULE: 4.0000 | ORAL_CAPSULE | Freq: Two times a day (BID) | ORAL | 0 refills | Status: AC

## 2022-04-14 MED ORDER — MOLNUPIRAVIR EUA 200MG CAPSULE: 4.0000 | ORAL_CAPSULE | Freq: Two times a day (BID) | ORAL | 0 refills | Status: DC

## 2023-11-09 ENCOUNTER — Emergency Department (HOSPITAL_COMMUNITY)
Admission: EM | Admit: 2023-11-09 | Discharge: 2023-11-09 | Disposition: A | Discharge status: Other Acute Inpatient Hospital | Attending: Emergency Medicine | Admitting: Emergency Medicine

## 2023-11-09 ENCOUNTER — Encounter: Payer: Self-pay | Admitting: Nurse Practitioner

## 2023-11-09 ENCOUNTER — Inpatient Hospital Stay
Admission: AD | Admit: 2023-11-09 | Discharge: 2023-11-15 | DRG: 885 | Disposition: A | Discharge status: Home / Self Care | Source: Intra-hospital | Attending: Psychiatry | Admitting: Psychiatry

## 2023-11-09 ENCOUNTER — Other Ambulatory Visit: Payer: Self-pay

## 2023-11-09 DIAGNOSIS — F1090 Alcohol use, unspecified, uncomplicated: Secondary | ICD-10-CM | POA: Diagnosis present

## 2023-11-09 DIAGNOSIS — Z9151 Personal history of suicidal behavior: Secondary | ICD-10-CM

## 2023-11-09 DIAGNOSIS — F329 Major depressive disorder, single episode, unspecified: Secondary | ICD-10-CM | POA: Diagnosis not present

## 2023-11-09 DIAGNOSIS — Z7984 Long term (current) use of oral hypoglycemic drugs: Secondary | ICD-10-CM | POA: Diagnosis not present

## 2023-11-09 DIAGNOSIS — F259 Schizoaffective disorder, unspecified: Secondary | ICD-10-CM | POA: Diagnosis present

## 2023-11-09 DIAGNOSIS — Z79899 Other long term (current) drug therapy: Secondary | ICD-10-CM | POA: Diagnosis not present

## 2023-11-09 DIAGNOSIS — J45909 Unspecified asthma, uncomplicated: Secondary | ICD-10-CM | POA: Diagnosis present

## 2023-11-09 DIAGNOSIS — Z7985 Long-term (current) use of injectable non-insulin antidiabetic drugs: Secondary | ICD-10-CM

## 2023-11-09 DIAGNOSIS — E7800 Pure hypercholesterolemia, unspecified: Secondary | ICD-10-CM | POA: Diagnosis present

## 2023-11-09 DIAGNOSIS — Y907 Blood alcohol level of 200-239 mg/100 ml: Secondary | ICD-10-CM | POA: Diagnosis not present

## 2023-11-09 DIAGNOSIS — F419 Anxiety disorder, unspecified: Secondary | ICD-10-CM | POA: Diagnosis present

## 2023-11-09 DIAGNOSIS — Z87891 Personal history of nicotine dependence: Secondary | ICD-10-CM

## 2023-11-09 DIAGNOSIS — R4689 Other symptoms and signs involving appearance and behavior: Secondary | ICD-10-CM | POA: Diagnosis present

## 2023-11-09 DIAGNOSIS — E119 Type 2 diabetes mellitus without complications: Secondary | ICD-10-CM | POA: Insufficient documentation

## 2023-11-09 DIAGNOSIS — R45851 Suicidal ideations: Secondary | ICD-10-CM | POA: Insufficient documentation

## 2023-11-09 DIAGNOSIS — I10 Essential (primary) hypertension: Secondary | ICD-10-CM | POA: Diagnosis present

## 2023-11-09 DIAGNOSIS — F10120 Alcohol abuse with intoxication, uncomplicated: Secondary | ICD-10-CM | POA: Diagnosis not present

## 2023-11-09 DIAGNOSIS — Z79890 Hormone replacement therapy (postmenopausal): Secondary | ICD-10-CM | POA: Diagnosis not present

## 2023-11-09 DIAGNOSIS — E1140 Type 2 diabetes mellitus with diabetic neuropathy, unspecified: Secondary | ICD-10-CM | POA: Diagnosis present

## 2023-11-09 DIAGNOSIS — Z5941 Food insecurity: Secondary | ICD-10-CM

## 2023-11-09 DIAGNOSIS — F332 Major depressive disorder, recurrent severe without psychotic features: Secondary | ICD-10-CM | POA: Insufficient documentation

## 2023-11-09 DIAGNOSIS — F331 Major depressive disorder, recurrent, moderate: Secondary | ICD-10-CM | POA: Diagnosis present

## 2023-11-09 DIAGNOSIS — F251 Schizoaffective disorder, depressive type: Secondary | ICD-10-CM | POA: Diagnosis not present

## 2023-11-09 DIAGNOSIS — F10920 Alcohol use, unspecified with intoxication, uncomplicated: Secondary | ICD-10-CM

## 2023-11-09 LAB — COMPREHENSIVE METABOLIC PANEL WITH GFR
ALT: 24 U/L (ref 0–44)
AST: 20 U/L (ref 15–41)
Albumin: 4.1 g/dL (ref 3.5–5.0)
Alkaline Phosphatase: 60 U/L (ref 38–126)
Anion gap: 15 (ref 5–15)
BUN: 10 mg/dL (ref 6–20)
CO2: 25 mmol/L (ref 22–32)
Calcium: 8.3 mg/dL — ABNORMAL LOW (ref 8.9–10.3)
Chloride: 103 mmol/L (ref 98–111)
Creatinine, Ser: 0.99 mg/dL (ref 0.61–1.24)
GFR, Estimated: >60 mL/min (ref >60)
Glucose, Bld: 131 mg/dL — ABNORMAL HIGH (ref 70–99)
Potassium: 3.7 mmol/L (ref 3.5–5.1)
Sodium: 143 mmol/L (ref 135–145)
Total Bilirubin: 0.5 mg/dL (ref 0.0–1.2)
Total Protein: 7.3 g/dL (ref 6.5–8.1)

## 2023-11-09 LAB — RAPID URINE DRUG SCREEN, HOSP PERFORMED
Amphetamines: NOT DETECTED
Barbiturates: NOT DETECTED
Benzodiazepines: NOT DETECTED
Cocaine: NOT DETECTED
Opiates: NOT DETECTED
Tetrahydrocannabinol: NOT DETECTED

## 2023-11-09 LAB — CBC
HCT: 43.2 % (ref 39.0–52.0)
Hemoglobin: 14.3 g/dL (ref 13.0–17.0)
MCH: 31.3 pg (ref 26.0–34.0)
MCHC: 33.1 g/dL (ref 30.0–36.0)
MCV: 94.5 fL (ref 80.0–100.0)
Platelets: 218 10*3/uL (ref 150–400)
RBC: 4.57 MIL/uL (ref 4.22–5.81)
RDW: 13.5 % (ref 11.5–15.5)
WBC: 6.4 10*3/uL (ref 4.0–10.5)
nRBC: 0 % (ref 0.0–0.2)

## 2023-11-09 LAB — ETHANOL: Alcohol, Ethyl (B): 220 mg/dL — ABNORMAL HIGH (ref <15)

## 2023-11-09 LAB — SALICYLATE LEVEL: Salicylate Lvl: <7 mg/dL — ABNORMAL LOW (ref 7.0–30.0)

## 2023-11-09 LAB — ACETAMINOPHEN LEVEL: Acetaminophen (Tylenol), Serum: <10 ug/mL — ABNORMAL LOW (ref 10–30)

## 2023-11-09 LAB — CBG MONITORING, ED: Glucose-Capillary: 137 mg/dL — ABNORMAL HIGH (ref 70–99)

## 2023-11-09 MED ORDER — ALUM & MAG HYDROXIDE-SIMETH 200-200-20 MG/5ML PO SUSP: 30.0000 mL | ORAL | Status: DC | PRN

## 2023-11-09 MED ORDER — MAGNESIUM HYDROXIDE 400 MG/5ML PO SUSP: 30.0000 mL | Freq: Every day | ORAL | Status: DC | PRN

## 2023-11-09 MED ORDER — TRAZODONE HCL 50 MG PO TABS
50.0000 mg | ORAL_TABLET | Freq: Every evening | ORAL | Status: DC | PRN
  Administered 2023-11-12 – 2023-11-14 (×3): 50 mg via ORAL
  Filled 2023-11-09 (×3): qty 1

## 2023-11-09 MED ORDER — ACETAMINOPHEN 325 MG PO TABS: 650.0000 mg | ORAL_TABLET | Freq: Four times a day (QID) | ORAL | Status: DC | PRN

## 2023-11-09 MED ORDER — HYDROXYZINE HCL 25 MG PO TABS
25.0000 mg | ORAL_TABLET | Freq: Three times a day (TID) | ORAL | Status: DC | PRN
  Administered 2023-11-12: 25 mg via ORAL
  Filled 2023-11-09: qty 1

## 2023-11-09 MED ORDER — OLANZAPINE 5 MG PO TBDP: 5.0000 mg | ORAL_TABLET | Freq: Three times a day (TID) | ORAL | Status: DC | PRN

## 2023-11-09 MED ORDER — OLANZAPINE 10 MG IM SOLR: 5.0000 mg | Freq: Three times a day (TID) | INTRAMUSCULAR | Status: DC | PRN

## 2023-11-09 MED ORDER — OLANZAPINE 10 MG IM SOLR: 10.0000 mg | Freq: Three times a day (TID) | INTRAMUSCULAR | Status: DC | PRN

## 2023-11-09 NOTE — Plan of Care (Signed)
 New admission.  Problem: Education: Goal: Knowledge of [facility] Education information/materials will improve Outcome: Not Progressing Goal: Emotional status will improve Outcome: Not Progressing Goal: Mental status will improve Outcome: Not Progressing Goal: Verbalization of understanding the information provided will improve Outcome: Not Progressing   Problem: Activity: Goal: Interest or engagement in activities will improve Outcome: Not Progressing Goal: Sleeping patterns will improve Outcome: Not Progressing   Problem: Coping: Goal: Ability to verbalize frustrations and anger appropriately will improve Outcome: Not Progressing Goal: Ability to demonstrate self-control will improve Outcome: Not Progressing   Problem: Health Behavior/Discharge Planning: Goal: Identification of resources available to assist in meeting health care needs will improve Outcome: Not Progressing Goal: Compliance with treatment plan for underlying cause of condition will improve Outcome: Not Progressing   Problem: Physical Regulation: Goal: Ability to maintain clinical measurements within normal limits will improve Outcome: Not Progressing   Problem: Safety: Goal: Periods of time without injury will increase Outcome: Not Progressing   Problem: Self-Concept: Goal: Ability to modify response to factors that promote anxiety will improve Outcome: Not Progressing   Problem: Education: Goal: Knowledge of [facility] Education information/materials will improve Outcome: Not Progressing   Problem: Coping: Goal: Coping ability will improve Outcome: Not Progressing Goal: Will verbalize feelings Outcome: Not Progressing   Problem: Coping: Goal: Coping ability will improve Outcome: Not Progressing   Problem: Health Behavior/Discharge Planning: Goal: Identification of resources available to assist in meeting health care needs will improve Outcome: Not Progressing

## 2023-11-09 NOTE — BH Assessment (Addendum)
 Comprehensive Clinical Assessment (CCA) Note  11/09/2023 Craig Fischer [id number] Disposition: Clinician discussed patient care with Gaither Pouch, NP.  He recommended inpatient psychiatric care.  Clinician informed Dr. Carlin Gula of recommended disposition via secure messaging.    Patient is oriented and has fleeting eye contact.  He says he hears voices sometimes that tell him bad things.  He is not responding to any internal stimuli.  Patient is oriented x4.  He speaks in a low tone and frequesntly will look up and say I don't know or it is too much for my mind.  Patient says he sleeps a lot but feels tired.    Patient has no outpatient care at this time.     Chief Complaint:  Chief Complaint  Patient presents with   Suicidal   Visit Diagnosis: MDD recurrent, severe; ETOH use d/o moderate    CCA Screening, Triage and Referral (STR)  Patient Reported Information How did you hear about us ? Family/Friend (Friend called 911.  Police responded.)  What Is the Reason for Your Visit/Call Today? Pt around midnight attempted to overdose on 15 tablets of Gabapentin.  He spat them out before he could swallow them however.  Pt is not sure of the dosage.  A cousin called 911 for him and the police arrived and brought him to [facility].  Pt had also been drinking ETOH.  He says he drank around 10-15 shots of vodka.  Patient BAL was 220 at 01;14.  Patient intended to end his life.  He says all the things going through my mind as the reason for his suicide attempt.  He had one previously about 15 years ago.  Pt has some HI but no plan or intention.  Pt sometimes hears a voice that tells him bad things.  No visual hallucinations.  Patient says he may drink 2 times in a week.  What he drinks may vary also.  Patient denies access to guns.  Patient medication is managed by his regular doctor.  Patient says he feels like he cannot get enough sleep.  Appetite is up and down.  Pt lives alone.  He is still  dealing with grandmother's death in 2021-12-01.  Patient also has a DWI charge pending adjudication.  How Long Has This Been Causing You Problems? > than 6 months  What Do You Feel Would Help You the Most Today? Treatment for Depression or other mood problem   Have You Recently Had Any Thoughts About Hurting Yourself? Yes  Are You Planning to Commit Suicide/Harm Yourself At This time? Yes   Flowsheet Row ED from 11/09/2023 in [facility] Emergency Department at [facility] UC from 04/13/2022 in [facility] ED from 12/06/2021 in [facility] Emergency Department at [facility]  C-SSRS RISK CATEGORY High Risk No Risk No Risk    Have you Recently Had Thoughts About Hurting Someone Craig Fischer? No  Are You Planning to Harm Someone at This Time? No  Explanation: Patient took a handful of gabapentin and put in his mouth but spit them out.  His intention was to kill himself.  No HI.   Have You Used Any Alcohol or Drugs in the Past 24 Hours? Yes  How Long Ago Did You Use Drugs or Alcohol? Around midngiht  What Did You Use and How Much? Drank 10-15 shots of vodka   Do You Currently Have a Therapist/Psychiatrist? No  Name of Therapist/Psychiatrist:    Have You Been Recently Discharged  From Any Public relations account executive or Programs? No  Explanation of Discharge From Practice/Program: No data recorded    CCA Screening Triage Referral Assessment Type of Contact: Tele-Assessment  Telemedicine Service Delivery:   Is this Initial or Reassessment? Is this Initial or Reassessment?: Initial Assessment  Date Telepsych consult ordered in [facility]:  Date Telepsych consult ordered in [facility]: 11/09/23  Time Telepsych consult ordered in [facility]:  Time Telepsych consult ordered in [facility]: 0253  Location of Assessment: [facility]  Provider Location: [facility] Services   Collateral Involvement: None   Does Patient Have a Automotive engineer Guardian? No  Legal Guardian Contact  Information: Pt does not have a legal guardian  Copy of Legal Guardianship Form: -- (Pt does not have a legal guardian)  Legal Guardian Notified of Arrival: -- (Pt does not have a legal guardian)  Legal Guardian Notified of Pending Discharge: -- (Pt does not have a legal guardian)  If Minor and Not Living with Parent(s), Who has Custody? Pt is an adult  Is CPS involved or ever been involved? Never  Is APS involved or ever been involved? Never   Patient Determined To Be At Risk for Harm To Self or Others Based on Review of Patient Reported Information or Presenting Complaint? Yes, for Self-Harm  Method: Plan with intent and identified person (Identified person is himself.)  Availability of Means: In hand or used (Attempted to take a handful of gabapentin.)  Intent: -- (Pt attempted to overdose on medication.)  Notification Required: No need or identified person  Additional Information for Danger to Others Potential: -- (Some HI.  No plan or intention.)  Additional Comments for Danger to Others Potential: Pt denies any plan or intention to harm anyone.  Are There Guns or Other Weapons in Your Home? No  Types of Guns/Weapons: No guns in the home  Are These Weapons Safely Secured?                            No  Who Could Verify You Are Able To Have These Secured: Pt denis guns in the home  Do You Have any Outstanding Charges, Pending Court Dates, Parole/Probation? Pt has a DWI case pending  Contacted To Inform of Risk of Harm To Self or Others: Other: Comment (Pt has some vague HI but no plan or intention.)    Does Patient Present under Involuntary Commitment? No    [place] of Residence: [place]   Patient Currently Receiving the Following Services: Not Receiving Services   Determination of Need: Urgent (48 hours)   Options For Referral: Inpatient Hospitalization     CCA Biopsychosocial Patient Reported Schizophrenia/Schizoaffective Diagnosis in Past:  No   Strengths: Pt says he is good at art.   Mental Health Symptoms Depression:  Change in energy/activity; Fatigue; Hopelessness; Worthlessness; Sleep (too much or little); Weight gain/loss   Duration of Depressive symptoms: Duration of Depressive Symptoms: Greater than two weeks   Mania:  None   Anxiety:   Difficulty concentrating; Tension; Worrying   Psychosis:  None   Duration of Psychotic symptoms:    Trauma:  N/A   Obsessions:  N/A   Compulsions:  N/A   Inattention:  N/A   Hyperactivity/Impulsivity:  None   Oppositional/Defiant Behaviors:  None   Emotional Irregularity:  Chronic feelings of emptiness   Other Mood/Personality Symptoms:  None    Mental Status Exam Appearance and self-care  Stature:  Average   Weight:  Overweight   Clothing:  Casual (Pt is in scrubs.)   Grooming:  Normal   Cosmetic use:  None   Posture/gait:  Normal   Motor activity:  Not Remarkable   Sensorium  Attention:  Normal   Concentration:  Normal   Orientation:  X5   Recall/memory:  Normal   Affect and Mood  Affect:  Depressed; Flat   Mood:  Depressed   Relating  Eye contact:  Fleeting   Facial expression:  Depressed; Sad   Attitude toward examiner:  Cooperative   Thought and Language  Speech flow: Soft   Thought content:  Appropriate to Mood and Circumstances   Preoccupation:  None   Hallucinations:  None   Organization:  Coherent   [employer] of Knowledge:  Average   Intelligence:  Average   Abstraction:  Normal   Judgement:  Poor   Reality Testing:  Realistic   Insight:  Fair   Decision Making:  Vacilates; Impulsive   Social Functioning  Social Maturity:  Impulsive   Social Judgement:  Heedless   Stress  Stressors:  Grief/losses; Legal; Financial   Coping Ability:  Overwhelmed; Exhausted   Skill Deficits:  Self-control; Self-care; Communication   Supports:  Family; Friends/Service system      Religion: Religion/Spirituality Are You A Religious Person?: Yes What is Your Religious Affiliation?: Christian How Might This Affect Treatment?: No affect on treatement  Leisure/Recreation: Leisure / Recreation Do You Have Hobbies?: No  Exercise/Diet: Exercise/Diet Do You Exercise?: Yes What Type of Exercise Do You Do?: Weight Training How Many Times a Week Do You Exercise?: 1-3 times a week Have You Gained or Lost A Significant Amount of Weight in the Past Six Months?: No Do You Follow a Special Diet?: No Do You Have Any Trouble Sleeping?: Yes Explanation of Sleeping Difficulties: Varies, feels fatigued a lot.   CCA Employment/Education Employment/Work Situation: Employment / Work Situation Employment Situation: On disability Why is Patient on Disability: Pt does not really know How Long has Patient Been on Disability: Maybe since 2010. Patient's Job has Been Impacted by Current Illness: No Has Patient ever Been in the [employer]?: No  Education: Education Is Patient Currently Attending School?: No Last Grade Completed: 12 Did You Attend College?: No Did You Have An Individualized Education Program (IIEP): No Did You Have Any Difficulty At School?: No Patient's Education Has Been Impacted by Current Illness: No   CCA Family/Childhood History Family and Relationship History: Family history Marital status: Long term relationship Long term relationship, how long?: Eight years What types of issues is patient dealing with in the relationship?: Some relationship problems Additional relationship information: None Does patient have children?: No  Childhood History:  Childhood History By whom was/is the patient raised?: Mother Did patient suffer any verbal/emotional/physical/sexual abuse as a child?: No Did patient suffer from severe childhood neglect?: No Has patient ever been sexually abused/assaulted/raped as an adolescent or adult?: No Was the patient ever a  victim of a crime or a disaster?: No Witnessed domestic violence?: No Has patient been affected by domestic violence as an adult?: No       CCA Substance Use Alcohol/Drug Use: Alcohol / Drug Use Pain Medications: See MAR Prescriptions: See MAR Over the Counter: See MAR History of alcohol / drug use?: Yes Longest period of sobriety (when/how long): Six years without any drinking Withdrawal Symptoms: None Substance #1 Name of Substance 1: ETOH 1 - Age of First Use: 38 years of age 14 -  Amount (size/oz): Varies 1 - Frequency: 1-2 times in a wee 1 - Duration: On and off 1 - Last Use / Amount: 11/08/23 around midnight 1 - Method of Aquiring: purchase 1- Route of Use: oral                       ASAM's:  Six Dimensions of Multidimensional Assessment  Dimension 1:  Acute Intoxication and/or Withdrawal Potential:      Dimension 2:  Biomedical Conditions and Complications:      Dimension 3:  Emotional, Behavioral, or Cognitive Conditions and Complications:     Dimension 4:  Readiness to Change:     Dimension 5:  Relapse, Continued use, or Continued Problem Potential:     Dimension 6:  Recovery/Living Environment:     ASAM Severity Score:    ASAM Recommended Level of Treatment:     Substance use Disorder (SUD)    Recommendations for Services/Supports/Treatments:    Disposition Recommendation per psychiatric provider: We recommend inpatient psychiatric hospitalization when medically cleared. Patient is under voluntary admission status at this time; please IVC if attempts to leave hospital.   DSM5 Diagnoses: Patient Active Problem List   Diagnosis Date Noted   Alcohol intoxication (HCC) 02/01/2016     Referrals to Alternative Service(s): Referred to Alternative Service(s):   Place:   Date:   Time:    Referred to Alternative Service(s):   Place:   Date:   Time:    Referred to Alternative Service(s):   Place:   Date:   Time:    Referred to Alternative  Service(s):   Place:   Date:   Time:     Craig Fischer, Craig Fischer

## 2023-11-09 NOTE — ED Notes (Signed)
 Patient roomed, placed on monitor per conversation with provider. Given urinal and encouraged to provide sample. Changed into hospital gown. Patient speaking with mother on facility phone at this time.

## 2023-11-09 NOTE — Progress Notes (Signed)
   11/09/23 1759  Charting Type  Charting Type Admission  Safety Check Verification  Has the RN verified the 15 minute safety check completion? Yes  Neurological  Neuro (WDL) WDL  HEENT  HEENT (WDL) X  Teeth Poor dental hygiene  Tongue Pink;Moist  Mucous Membrane(s) Moist;Pink  Voice Clear  Respiratory  Respiratory (WDL) X (hx. Asthma)  Cardiac  Cardiac (WDL) X (HTN)  Vascular  Vascular (WDL) WDL  Integumentary  Integumentary (WDL) X  Staff Member Assisting with Skin Assessment on Admission Cara, MHT  Skin Color Appropriate for ethnicity  Skin Condition Dry  Skin Integrity Other (Comment) (mole to the middle of his back; old scar from nail injury to the upper/right back/shoulder area)  Skin Turgor Non-tenting  Braden Scale (Ages 8 and up)  Sensory Perceptions 4  Moisture 4  Activity 4  Mobility 4  Nutrition 3  Friction and Shear 3  Braden Scale Score 22  Musculoskeletal  Musculoskeletal (WDL) WDL  Assistive Device None  Gastrointestinal  Gastrointestinal (WDL) WDL  Last BM Date  11/08/23  GU Assessment  Genitourinary (WDL) WDL  Genitalia  Male Genitalia Intact  Neurological  Level of Consciousness Alert

## 2023-11-09 NOTE — ED Triage Notes (Signed)
 Pt bib RPD, pt states  I have a lot on my mind and tried to put myself outta my misery. Pt reports taking a handful of Gabapentin and drinking alcohol tonight

## 2023-11-09 NOTE — Group Note (Signed)
 LCSW Group Therapy Note   Group Date: 11/09/2023 Start Time: 1300 End Time: 1400   Type of Therapy and Topic:  Group Therapy: Challenging Core Beliefs  Participation Level:  Active  Description of Group:  Patients were educated about core beliefs and asked to identify one harmful core belief that they have. Patients were asked to explore from where those beliefs originate. Patients were asked to discuss how those beliefs make them feel and the resulting behaviors of those beliefs. They were then be asked if those beliefs are true and, if so, what evidence they have to support them. Lastly, group members were challenged to replace those negative core beliefs with helpful beliefs.   Therapeutic Goals:   1. Patient will identify harmful core beliefs and explore the origins of such beliefs. 2. Patient will identify feelings and behaviors that result from those core beliefs. 3. Patient will discuss whether such beliefs are true. 4.  Patient will replace harmful core beliefs with helpful ones.  Summary of Patient Progress:  Patient minimally engaged in processing and exploring how core beliefs are formed and how they impact thoughts, feelings, and behaviors. Patient proved open to input from peers and feedback from CSW. Patient demonstrated basic insight into the subject matter, was respectful and supportive of peers, and participated throughout the entire session.  Therapeutic Modalities: Cognitive Behavioral Therapy; Solution-Focused Therapy   Raaga Maeder M Audre Cenci, LCSWA 11/09/2023  2:47 PM

## 2023-11-09 NOTE — ED Notes (Signed)
 Placed Pt belongings in locker 11

## 2023-11-09 NOTE — ED Provider Notes (Signed)
 [place] EMERGENCY DEPARTMENT AT [facility]  Provider Note  CSN: [id number] Arrival date & time: 11/09/23 0036  History Chief Complaint  Patient presents with   Suicidal    Craig Fischer is a 38 y.o. male with history of HTN, DM, HLD reports he has had 'a lot going on' recently and has been having SI. Tonight he started to take an overdose of gabapentin, but reports he spit it out before swallowing any pills. He has been drinking alcohol. Denies any other drug use or ingestions.    Home Medications Prior to Admission medications   Medication Sig Start Date End Date Taking? Authorizing Provider  albuterol (VENTOLIN HFA) 108 (90 Base) MCG/ACT inhaler Inhale 2 puffs into the lungs every 6 (six) hours as needed for shortness of breath. 02/07/22   [provider]  atorvastatin (LIPITOR) 80 MG tablet Take 80 mg by mouth daily. 10/02/21   [provider]  FARXIGA 10 MG TABS tablet Take 10 mg by mouth daily. 04/13/22   [provider]  fluticasone  (FLONASE ) 50 MCG/ACT nasal spray Place 2 sprays into both nostrils daily. 04/13/22   Leath-Warren, Etta PARAS, NP  glimepiride (AMARYL) 2 MG tablet Take 2 mg by mouth every morning. 10/02/21   [provider]  levothyroxine (SYNTHROID, LEVOTHROID) 300 MCG tablet Take 300 mcg by mouth daily before breakfast.    [provider]  metFORMIN (GLUCOPHAGE-XR) 500 MG 24 hr tablet Take 500 mg by mouth daily with breakfast. 12/08/21   [provider]  vitamin B-12 (CYANOCOBALAMIN) 500 MCG tablet Take 500 mcg by mouth daily.    [provider]     Allergies    Hydrocodone and Robitussin dm max day-night   Review of Systems   Review of Systems Please see HPI for pertinent positives and negatives  Physical Exam BP 117/79   Pulse (!) 103   Temp 98.3 F (36.8 C)   Resp 20   Ht 5' 8 (1.727 m)   Wt 111.1 kg   SpO2 94%   BMI 37.25 kg/m   Physical Exam Vitals and nursing note  reviewed.  Constitutional:      Appearance: Normal appearance.  HENT:     Head: Normocephalic and atraumatic.     Nose: Nose normal.     Mouth/Throat:     Mouth: Mucous membranes are moist.   Eyes:     Extraocular Movements: Extraocular movements intact.     Conjunctiva/sclera: Conjunctivae normal.    Cardiovascular:     Rate and Rhythm: Normal rate.  Pulmonary:     Effort: Pulmonary effort is normal.     Breath sounds: Normal breath sounds.  Abdominal:     General: Abdomen is flat.     Palpations: Abdomen is soft.     Tenderness: There is no abdominal tenderness.   Musculoskeletal:        General: No swelling. Normal range of motion.     Cervical back: Neck supple.   Skin:    General: Skin is warm and dry.   Neurological:     General: No focal deficit present.     Mental Status: He is alert.   Psychiatric:     Comments: Flat affect     ED Results / Procedures / Treatments   EKG EKG Interpretation Date/Time:  Tuesday November 09 2023 01:27:26 EDT Ventricular Rate:  107 PR Interval:  97 QRS Duration:  86 QT Interval:  326 QTC Calculation: 435 R Axis:  44  Text Interpretation: Sinus tachycardia Repol abnrm suggests ischemia, diffuse leads No significant change since last tracing Confirmed by Roselyn Dunnings [phone] on 11/09/2023 1:47:19 AM  Procedures Procedures  Medications Ordered in the ED Medications - No data to display  Initial Impression and Plan  Patient brought to the ED by [facility] from home voluntarily for SI and possible overdose. Patient reports he spit out the pills before swallowing them. Will check labs and EKG. Anticipate TTS consult if no concerning findings.   ED Course   Clinical Course as of 11/09/23 0532  Tue Nov 09, 2023  0205 CBC is normal.  [CS]  0250 CMP is unremarkable.  [CS]  0251 ASA and APAP are neg. EtOH is elevated. Medically cleared for TTS consult.  [CS]  [id number] Patient evaluated by TTS who recommend inpatient treatment. He  remains voluntary.  [CS]    Clinical Course User Index [CS] Roselyn Dunnings NOVAK, MD     MDM Rules/Calculators/A&P Medical Decision Making Problems Addressed: Alcoholic intoxication without complication [facility]): acute illness or injury Suicidal ideation: acute illness or injury  Amount and/or Complexity of Data Reviewed Labs: ordered. Decision-making details documented in ED Course. ECG/medicine tests: ordered and independent interpretation performed. Decision-making details documented in ED Course.  Risk Decision regarding hospitalization.     Final Clinical Impression(s) / ED Diagnoses Final diagnoses:  Suicidal ideation  Alcoholic intoxication without complication [facility])    Rx / DC Orders ED Discharge Orders     None        Roselyn Dunnings NOVAK, MD 11/09/23 [phone]

## 2023-11-09 NOTE — Progress Notes (Signed)
 Pt was accepted to [facility] BMU TODAY 11/09/2023 Bed assignment: 319  Pt meets inpatient criteria per: Elveria Batter NP  Attending Physician will be Donnelly MD   Report can be called to: [phone]  Pt can arrive after discharges   Care Team Notified:  Cherylynn Ernst RN, Sari Mandril RN  [place] Jailee Jaquez LCSW-A   11/09/2023 9:48 AM

## 2023-11-09 NOTE — Tx Team (Signed)
 Initial Treatment Plan 11/09/2023 6:10 PM Jerona MCLANE ARORA FMW:[id number]    PATIENT STRESSORS: Financial difficulties   Health problems   Legal issue   Medication change or noncompliance     PATIENT STRENGTHS: Ability for insight  General fund of knowledge  Motivation for treatment/growth  Supportive family/friends  Work skills    PATIENT IDENTIFIED PROBLEMS: OD prior to admission  Alcohol abuse  Anxiety  Depression  Financial issues  Legal issues           DISCHARGE CRITERIA:  Ability to meet basic life and health needs Improved stabilization in mood, thinking, and/or behavior Need for constant or close observation no longer present Reduction of life-threatening or endangering symptoms to within safe limits  PRELIMINARY DISCHARGE PLAN: Outpatient therapy Return to previous living arrangement Return to previous work or school arrangements  PATIENT/FAMILY INVOLVEMENT: This treatment plan has been presented to and reviewed with the patient, JEFFRY VOGELSANG. The patient and family have been given the opportunity to ask questions and make suggestions.  Wynton Hufstetler, RN 11/09/2023, 6:10 PM

## 2023-11-09 NOTE — Progress Notes (Signed)
   11/09/23 1759  Psych Admission Type (Psych Patients Only)  Admission Status Voluntary  Psychosocial Assessment  Patient Complaints Anxiety;Depression;Worrying  Eye Contact Brief  Facial Expression Worried  Affect Appropriate to circumstance  Speech Soft;Logical/coherent  Interaction Isolative;Minimal (patient has been isolative to room, excpet to make a phone call)  Motor Activity Slow  Appearance/Hygiene In scrubs  Behavior Characteristics Cooperative;Appropriate to situation  Mood Pleasant  Aggressive Behavior  Effect No apparent injury  Thought Process  Coherency WDL  Content Blaming self (patient states that he drank too much last night)  Delusions None reported or observed  Perception WDL  Hallucination None reported or observed  Judgment WDL  Confusion None  Danger to Self  Current suicidal ideation? Denies  Danger to Others  Danger to Others None reported or observed   Patient's goal for treatment is to cut off the drinking and smoking black and mild cigars.

## 2023-11-09 NOTE — ED Provider Notes (Signed)
 Pt accepted to [facility] b[facility] unit, Dr Donnelly, pt is awake and alert, ambulatory, steady gait. No pain or distress. Vitals stable. Pt currently appears stable for transport/transfer.    Craig Drivers, MD 11/09/23 1055

## 2023-11-09 NOTE — Group Note (Signed)
 Recreation Therapy Group Note   Group Topic:Stress Management  Group Date: 11/09/2023 Start Time: 1530 End Time: 1620 Facilitators: Celestia Jeoffrey FORBES ARTICE, CTRS Location: Craft Room  Group Description: Taboo. LRT and patients played the game Taboo. The object of the game is to have peers guess the word up at the top of the card drawn that is in bold, while being sure to not use any of the descriptive words down below it on the same card. If the person attempting to explain the word in bold uses any of the descriptive words down below, they lose their turn, and no one receives that card or point. LRT and patient's took turns being the one to describe the words while the rest of the group tried to guess what they were describing.   Goal Area(s) Addressed: Patient will identify physical symptoms of stress. Patient will identify emotional symptoms of stress. Patient will identify coping skills for stress. Patient will build frustration tolerance skills.  Patient will increase communication.   Affect/Mood: N/A   Participation Level: Did not attend    Clinical Observations/Individualized Feedback: Patient did not attend group.   Plan: Continue to engage patient in RT group sessions 2-3x/week.   Jeoffrey FORBES Celestia, LRT, CTRS 11/09/2023 5:45 PM

## 2023-11-10 DIAGNOSIS — F329 Major depressive disorder, single episode, unspecified: Secondary | ICD-10-CM

## 2023-11-10 MED ORDER — SERTRALINE HCL 25 MG PO TABS
50.0000 mg | ORAL_TABLET | Freq: Every day | ORAL | Status: DC
  Administered 2023-11-11 – 2023-11-15 (×5): 50 mg via ORAL
  Filled 2023-11-10 (×5): qty 2

## 2023-11-10 MED ORDER — ARIPIPRAZOLE 5 MG PO TABS
5.0000 mg | ORAL_TABLET | Freq: Every day | ORAL | Status: DC
  Administered 2023-11-11 – 2023-11-15 (×5): 5 mg via ORAL
  Filled 2023-11-10 (×5): qty 1

## 2023-11-10 MED ORDER — LEVOTHYROXINE SODIUM 50 MCG PO TABS
100.0000 ug | ORAL_TABLET | Freq: Every day | ORAL | Status: DC
  Administered 2023-11-11 – 2023-11-15 (×5): 100 ug via ORAL
  Filled 2023-11-10 (×5): qty 2

## 2023-11-10 MED ORDER — GABAPENTIN 100 MG PO CAPS
100.0000 mg | ORAL_CAPSULE | Freq: Three times a day (TID) | ORAL | Status: DC
  Administered 2023-11-11 – 2023-11-15 (×14): 100 mg via ORAL
  Filled 2023-11-10 (×12): qty 1

## 2023-11-10 MED ORDER — LEVOTHYROXINE SODIUM 50 MCG PO TABS: 175.0000 ug | ORAL_TABLET | ORAL | Status: DC

## 2023-11-10 MED ORDER — LEVOTHYROXINE SODIUM 50 MCG PO TABS
175.0000 ug | ORAL_TABLET | Freq: Every day | ORAL | Status: DC
  Administered 2023-11-11 – 2023-11-15 (×5): 175 ug via ORAL
  Filled 2023-11-10 (×5): qty 4

## 2023-11-10 NOTE — Group Note (Signed)
 Date:  11/10/2023 Time:  10:27 AM  Group Topic/Focus:  Goals Group:   The focus of this group is to help patients establish daily goals to achieve during treatment and discuss how the patient can incorporate goal setting into their daily lives to aide in recovery.    Participation Level:  Did Not Attend   Craig Fischer 11/10/2023, 10:27 AM

## 2023-11-10 NOTE — Group Note (Signed)
 Date:  11/10/2023 Time:  10:26 PM  Group Topic/Focus:  Wrap-Up Group:   The focus of this group is to help patients review their daily goal of treatment and discuss progress on daily workbooks.    Participation Level:  Did Not Attend   Additional Comments:    Kristen VEAR Gibbon 11/10/2023, 10:26 PM

## 2023-11-10 NOTE — BH IP Treatment Plan (Signed)
 Interdisciplinary Treatment and Diagnostic Plan Update  11/10/2023 Time of Session: 10:31 AM Craig Fischer MRN: [id number]  Principal Diagnosis: MDD (major depressive disorder)  Secondary Diagnoses: Principal Problem:   MDD (major depressive disorder)   Current Medications:  Current Facility-Administered Medications  Medication Dose Route Frequency Provider Last Rate Last Admin   acetaminophen  (TYLENOL ) tablet 650 mg  650 mg Oral Q6H PRN Mardy Legacy, NP       alum & mag hydroxide-simeth (MAALOX/MYLANTA) 200-200-20 MG/5ML suspension 30 mL  30 mL Oral Q4H PRN Mardy Legacy, NP       hydrOXYzine (ATARAX) tablet 25 mg  25 mg Oral TID PRN Mardy Legacy, NP       magnesium hydroxide (MILK OF MAGNESIA) suspension 30 mL  30 mL Oral Daily PRN Mardy Legacy, NP       OLANZapine (ZYPREXA) injection 10 mg  10 mg Intramuscular TID PRN Mardy Legacy, NP       OLANZapine (ZYPREXA) injection 5 mg  5 mg Intramuscular TID PRN Mardy Legacy, NP       OLANZapine zydis (ZYPREXA) disintegrating tablet 5 mg  5 mg Oral TID PRN Mardy Legacy, NP       traZODone (DESYREL) tablet 50 mg  50 mg Oral QHS PRN Mardy Legacy, NP       PTA Medications: Medications Prior to Admission  Medication Sig Dispense Refill Last Dose/Taking   albuterol (VENTOLIN HFA) 108 (90 Base) MCG/ACT inhaler Inhale 2 puffs into the lungs every 6 (six) hours as needed for shortness of breath.      atorvastatin (LIPITOR) 80 MG tablet Take 80 mg by mouth daily.      FARXIGA 10 MG TABS tablet Take 10 mg by mouth daily.      fluticasone  (FLONASE ) 50 MCG/ACT nasal spray Place 2 sprays into both nostrils daily. 16 g 0    gabapentin (NEURONTIN) 100 MG capsule Take 100 mg by mouth 3 (three) times daily.      levothyroxine (SYNTHROID) 100 MCG tablet Take 100 mcg by mouth See admin instructions. Take 175 mcg with 100 mcg to equal (275 mcg) daily with breakfast.      levothyroxine (SYNTHROID) 175 MCG tablet Take 175 mcg by  mouth See admin instructions. Take 175 mcg with 100 mcg to equal (275 mcg) daily with breakfast.      metFORMIN (GLUCOPHAGE-XR) 500 MG 24 hr tablet Take 500 mg by mouth daily with breakfast.      OZEMPIC, 1 MG/DOSE, 4 MG/3ML SOPN Inject 1 mg into the skin once a week.       Patient Stressors: Financial difficulties   Health problems   Legal issue   Medication change or noncompliance    Patient Strengths: Ability for insight  General fund of knowledge  Motivation for treatment/growth  Supportive family/friends  Work skills   Treatment Modalities: Medication Management, Group therapy, Case management,  1 to 1 session with clinician, Psychoeducation, Recreational therapy.   Physician Treatment Plan for Primary Diagnosis: MDD (major depressive disorder) Long Term Goal(s):     Short Term Goals:    Medication Management: Evaluate patient's response, side effects, and tolerance of medication regimen.  Therapeutic Interventions: 1 to 1 sessions, Unit Group sessions and Medication administration.  Evaluation of Outcomes: Not Met  Physician Treatment Plan for Secondary Diagnosis: Principal Problem:   MDD (major depressive disorder)  Long Term Goal(s):     Short Term Goals:       Medication Management: Evaluate patient's response, side effects,  and tolerance of medication regimen.  Therapeutic Interventions: 1 to 1 sessions, Unit Group sessions and Medication administration.  Evaluation of Outcomes: Not Met   RN Treatment Plan for Primary Diagnosis: MDD (major depressive disorder) Long Term Goal(s): Knowledge of disease and therapeutic regimen to maintain health will improve  Short Term Goals: Ability to verbalize frustration and anger appropriately will improve, Ability to demonstrate self-control, Ability to participate in decision making will improve, Ability to verbalize feelings will improve, Ability to disclose and discuss suicidal ideas, and Ability to identify and develop  effective coping behaviors will improve  Medication Management: RN will administer medications as ordered by provider, will assess and evaluate patient's response and provide education to patient for prescribed medication. RN will report any adverse and/or side effects to prescribing provider.  Therapeutic Interventions: 1 on 1 counseling sessions, Psychoeducation, Medication administration, Evaluate responses to treatment, Monitor vital signs and CBGs as ordered, Perform/monitor CIWA, COWS, AIMS and Fall Risk screenings as ordered, Perform wound care treatments as ordered.  Evaluation of Outcomes: Not Met   LCSW Treatment Plan for Primary Diagnosis: MDD (major depressive disorder) Long Term Goal(s): Safe transition to appropriate next level of care at discharge, Engage patient in therapeutic group addressing interpersonal concerns.  Short Term Goals: Engage patient in aftercare planning with referrals and resources, Increase social support, Increase ability to appropriately verbalize feelings, Increase emotional regulation, Facilitate acceptance of mental health diagnosis and concerns, Facilitate patient progression through stages of change regarding substance use diagnoses and concerns, Identify triggers associated with mental health/substance abuse issues, and Increase skills for wellness and recovery  Therapeutic Interventions: Assess for all discharge needs, 1 to 1 time with Social worker, Explore available resources and support systems, Assess for adequacy in community support network, Educate family and significant other(s) on suicide prevention, Complete Psychosocial Assessment, Interpersonal group therapy.  Evaluation of Outcomes: Not Met   Progress in Treatment: Attending groups: Yes. and No. Participating in groups: Yes. and No. Taking medication as prescribed: Yes. Toleration medication: Yes. Family/Significant other contact made: No, will contact:  CSW to contact once permission  is granted.  Patient understands diagnosis: Yes. Discussing patient identified problems/goals with staff: Yes. Medical problems stabilized or resolved: Yes. Denies suicidal/homicidal ideation: Yes. Issues/concerns per patient self-inventory: No. Other: None  New problem(s) identified: No, Describe:  None  New Short Term/Long Term Goal(s):detox, elimination of symptoms of psychosis, medication management for mood stabilization; elimination of SI thoughts; development of comprehensive mental wellness/sobriety plan.    Patient Goals:  Trying to get myself better so I can get out and go to [employer]  Discharge Plan or Barriers: CSW to assist with the development of appropriate discharge plan.    Reason for Continuation of Hospitalization: Anxiety Depression Suicidal ideation  Estimated Length of Stay: 1-7 days.   Last 3 [place] Suicide Severity Risk Score: Flowsheet Row Admission (Current) from 11/09/2023 in [facility] UC from 04/13/2022 in [facility] at [facility] ED from 12/06/2021 in [facility] Department at [facility]  C-SSRS RISK CATEGORY High Risk No Risk No Risk    Last PHQ 2/9 Scores:     No data to display          Scribe for Treatment Team: Ryna Beckstrom M Linkon Siverson, LCSW 11/10/2023 11:15 AM

## 2023-11-10 NOTE — Plan of Care (Signed)
   Problem: Education: Goal: Knowledge of [place] [facility] Education information/materials will improve Outcome: Progressing Goal: Emotional status will improve Outcome: Progressing Goal: Mental status will improve Outcome: Progressing

## 2023-11-10 NOTE — Progress Notes (Signed)
   11/10/23 0830  Psych Admission Type (Psych Patients Only)  Admission Status Voluntary  Psychosocial Assessment  Patient Complaints Anxiety;Depression  Eye Contact Fair  Facial Expression Anxious  Affect Appropriate to circumstance  Speech Soft;Logical/coherent  Interaction Isolative  Motor Activity Slow  Appearance/Hygiene In scrubs  Behavior Characteristics Cooperative;Appropriate to situation  Mood Pleasant  Aggressive Behavior  Effect No apparent injury  Thought Process  Coherency WDL  Content Blaming self  Delusions None reported or observed  Perception WDL  Hallucination None reported or observed  Judgment WDL  Confusion None  Danger to Self  Current suicidal ideation? Denies  Danger to Others  Danger to Others None reported or observed

## 2023-11-10 NOTE — H&P (Signed)
 Psychiatric Admission Assessment Adult  Patient Identification: Craig Fischer MRN:  [id number] Date of Evaluation:  11/10/2023 Chief Complaint:  MDD (major depressive disorder) [F32.9] Ed Chart:Pt around midnight attempted to overdose on 15 tablets of Gabapentin. He spat them out before he could swallow them however. Pt is not sure of the dosage. A cousin called 911 for him and the police arrived and brought him to [facility]. Pt had also been drinking ETOH. He says he drank around 10-15 shots of vodka. Patient BAL was 220 at 01;14. Patient intended to end his life. He says all the things going through my mind as the reason for his suicide attempt. He had one previously about 15 years ago. Pt has some HI but no plan or intention. Pt sometimes hears a voice that tells him bad things. No visual hallucinations. Patient says he may drink 2 times in a week. What he drinks may vary also. Patient denies access to guns. Patient medication is managed by his regular doctor. Patient says he feels like he cannot get enough sleep. Appetite is up and down. Pt lives alone. He is still dealing with grandmother's death in 11/28/21. Patient also has a DWI charge pending adjudication.   History of Present Illness: Patient is a 38 year old African-American male with history of schizoaffective disorder presented to the emergency room after reported overdose on gabapentin pills as an attempt of suicide in the context of being overwhelmed with multiple thoughts and alcohol intake.  Today on interview patient is noted to be resting in bed.  He reports the main stressors not able to spend too much time with his girlfriend, being busy in doing chores for his parents.  He has hard time answering the questions and is noted to be very concrete.  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  He reports that he was supposed to pick up a car and do other chores which he was unable to accomplish that day which made him very frustrated leading up to  the overdose and alcohol.  He reports having some anxiety but denies panic attacks.  He endorses auditory hallucinations stating that it is not his voice telling him to do bad things like killing himself and others.  He denies having suicidal/homicidal intent or plan.  He denies visual hallucinations.  He does endorse paranoia feeling people are out to get him.  Total Time spent with patient: 45 minutes Sleep  Sleep:Sleep: Fair  Past Psychiatric History:  Psychiatric History:  Information collected from patient  Prev Dx/Sx: Schizoaffective disorder Current Psych Provider: None reported Home Meds (current): Unable to recall Previous Med Trials: Unable to recall Therapy: Denies  Prior Psych Hospitalization: Denies Prior Self Harm: Denies Prior Violence: Denies  Family Psych History: None reported Family Hx suicide: None reported  Social History:   Educational Hx: GED Occupational Hx: Landscaping, on disability Legal Hx: Denies Living Situation: By himself Spiritual Hx: None reported Access to weapons/lethal means: Denies  Substance History Alcohol: 1 beer intermittent use  History of alcohol withdrawal seizures denies History of DT's denies Tobacco: Cigars Illicit drugs: Denies Prescription drug abuse: Denies Rehab hx: Denies Is the patient at risk to self? No.  Has the patient been a risk to self in the past 6 months? No.  Has the patient been a risk to self within the distant past? No.  Is the patient a risk to others? No.  Has the patient been a risk to others in the past 6 months? No.  Has  the patient been a risk to others within the distant past? No.   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 No pertinent family history.  Social History:  Social History   Substance and Sexual Activity  Alcohol Use Not Currently     Social History   Substance and Sexual Activity  Drug Use No      Allergies:   Allergies  Allergen Reactions   Hydrocodone Other (See Comments)    Panic attack   Robitussin Dm Max Day-Night Hives   Lab Results:  Results for orders placed or performed during the hospital encounter of 11/09/23 (from the past 48 hours)  Comprehensive metabolic panel     Status: Abnormal   Collection Time: 11/09/23  1:14 AM  Result Value Ref Range   Sodium 143 135 - 145 mmol/L   Potassium 3.7 3.5 - 5.1 mmol/L   Chloride 103 98 - 111 mmol/L   CO2 25 22 - 32 mmol/L   Glucose, Bld 131 (H) 70 - 99 mg/dL    Comment: Glucose reference range applies only to samples taken after fasting for at least 8 hours.   BUN 10 6 - 20 mg/dL   Creatinine, Ser 9.00 0.61 - 1.24 mg/dL   Calcium 8.3 (L) 8.9 - 10.3 mg/dL   Total Protein 7.3 6.5 - 8.1 g/dL   Albumin 4.1 3.5 - 5.0 g/dL   AST 20 15 - 41 U/L   ALT 24 0 - 44 U/L   Alkaline Phosphatase 60 38 - 126 U/L   Total Bilirubin 0.5 0.0 - 1.2 mg/dL   GFR, Estimated >39 >39 mL/min    Comment: (NOTE) Calculated using the CKD-EPI Creatinine Equation (2021)    Anion gap 15 5 - 15    Comment: Performed at [facility], [address], [place], [place] [address]  Ethanol     Status: Abnormal   Collection Time: 11/09/23  1:14 AM  Result Value Ref Range   Alcohol, Ethyl (B)  220 (H) <15 mg/dL    Comment: (NOTE) For medical purposes only. Performed at [facility], [address] [place], [place] [address]   cbc     Status: None   Collection Time: 11/09/23  1:14 AM  Result Value Ref Range   WBC 6.4 4.0 - 10.5 K/uL   RBC 4.57 4.22 - 5.81 MIL/uL   Hemoglobin 14.3 13.0 - 17.0 g/dL   HCT 56.7 60.9 - 47.9 %   MCV 94.5 80.0 - 100.0 fL   MCH 31.3 26.0 - 34.0 pg   MCHC 33.1 30.0 - 36.0 g/dL   RDW 86.4 88.4 - 84.4 %   Platelets 218 150 - 400 K/uL  nRBC 0.0 0.0 - 0.2 %    Comment: Performed at [facility], [address], [place], [place] [address]  Salicylate level     Status: Abnormal   Collection Time: 11/09/23  1:14 AM  Result Value Ref Range   Salicylate Lvl <7.0 (L) 7.0 - 30.0 mg/dL    Comment: Performed at [facility], [address] [place], [place] [address]  Acetaminophen  level     Status: Abnormal   Collection Time: 11/09/23  1:14 AM  Result Value Ref Range   Acetaminophen  (Tylenol ), Serum <10 (L) 10 - 30 ug/mL    Comment: (NOTE) Therapeutic concentrations vary significantly. A range of 10-30 ug/mL  may be an effective concentration for many patients. However, some  are best treated at concentrations outside of this range. Acetaminophen  concentrations >150 ug/mL at 4 hours after ingestion  and >50 ug/mL at 12 hours after ingestion are often associated with  toxic reactions.  Performed at [facility], [address] [place], [place] [address]   CBG monitoring, ED     Status: Abnormal   Collection Time: 11/09/23  1:18 AM  Result Value Ref Range   Glucose-Capillary 137 (H) 70 - 99 mg/dL    Comment: Glucose reference range applies only to samples taken after fasting for at least 8 hours.  Rapid urine drug screen (hospital performed)     Status: None   Collection Time: 11/09/23  2:22 AM  Result Value Ref Range   Opiates NONE DETECTED NONE DETECTED   Cocaine NONE DETECTED NONE DETECTED   Benzodiazepines NONE DETECTED NONE DETECTED    Amphetamines NONE DETECTED NONE DETECTED   Tetrahydrocannabinol NONE DETECTED NONE DETECTED   Barbiturates NONE DETECTED NONE DETECTED    Comment: (NOTE) DRUG SCREEN FOR MEDICAL PURPOSES ONLY.  IF CONFIRMATION IS NEEDED FOR ANY PURPOSE, NOTIFY LAB WITHIN 5 DAYS.  LOWEST DETECTABLE LIMITS FOR URINE DRUG SCREEN Drug Class                     Cutoff (ng/mL) Amphetamine and metabolites    1000 Barbiturate and metabolites    200 Benzodiazepine                 200 Opiates and metabolites        300 Cocaine and metabolites        300 THC                            50 Performed at [facility], [address] [place], [place] [address]     Blood Alcohol level:  Lab Results  Component Value Date   ETH 220 (H) 11/09/2023   ETH 255 (H) 02/01/2016    Metabolic Disorder Labs:  Lab Results  Component Value Date   HGBA1C (H) 03/14/2010    15.4 (NOTE)                                                                       According to the ADA Clinical Practice Recommendations for 2011, when HbA1c is used as a screening test:   >=6.5%   Diagnostic of Diabetes Mellitus           (if  abnormal result  is confirmed)  5.7-6.4%   Increased risk of developing Diabetes Mellitus  References:Diagnosis and Classification of Diabetes Mellitus,Diabetes Care,2011,34(Suppl 1):S62-S69 and Standards of Medical Care in         Diabetes - 2011,Diabetes Care,2011,34  (Suppl 1):S11-S61.   MPG 395 (H) 03/14/2010   No results found for: PROLACTIN No results found for: CHOL, TRIG, HDL, CHOLHDL, VLDL, LDLCALC  Current Medications: Current Facility-Administered Medications  Medication Dose Route Frequency Provider Last Rate Last Admin   acetaminophen  (TYLENOL ) tablet 650 mg  650 mg Oral Q6H PRN Mardy Legacy, NP       alum & mag hydroxide-simeth (MAALOX/MYLANTA) 200-200-20 MG/5ML suspension 30 mL  30 mL Oral Q4H PRN Mardy Legacy, NP       hydrOXYzine (ATARAX) tablet 25 mg  25 mg Oral TID PRN  Mardy Legacy, NP       magnesium hydroxide (MILK OF MAGNESIA) suspension 30 mL  30 mL Oral Daily PRN Mardy Legacy, NP       OLANZapine (ZYPREXA) injection 10 mg  10 mg Intramuscular TID PRN Mardy Legacy, NP       OLANZapine (ZYPREXA) injection 5 mg  5 mg Intramuscular TID PRN Mardy Legacy, NP       OLANZapine zydis (ZYPREXA) disintegrating tablet 5 mg  5 mg Oral TID PRN Mardy Legacy, NP       traZODone (DESYREL) tablet 50 mg  50 mg Oral QHS PRN Mardy Legacy, NP       PTA Medications: Medications Prior to Admission  Medication Sig Dispense Refill Last Dose/Taking   albuterol (VENTOLIN HFA) 108 (90 Base) MCG/ACT inhaler Inhale 2 puffs into the lungs every 6 (six) hours as needed for shortness of breath.      atorvastatin (LIPITOR) 80 MG tablet Take 80 mg by mouth daily.      FARXIGA 10 MG TABS tablet Take 10 mg by mouth daily.      fluticasone  (FLONASE ) 50 MCG/ACT nasal spray Place 2 sprays into both nostrils daily. 16 g 0    gabapentin (NEURONTIN) 100 MG capsule Take 100 mg by mouth 3 (three) times daily.      levothyroxine (SYNTHROID) 100 MCG tablet Take 100 mcg by mouth See admin instructions. Take 175 mcg with 100 mcg to equal (275 mcg) daily with breakfast.      levothyroxine (SYNTHROID) 175 MCG tablet Take 175 mcg by mouth See admin instructions. Take 175 mcg with 100 mcg to equal (275 mcg) daily with breakfast.      metFORMIN (GLUCOPHAGE-XR) 500 MG 24 hr tablet Take 500 mg by mouth daily with breakfast.      OZEMPIC, 1 MG/DOSE, 4 MG/3ML SOPN Inject 1 mg into the skin once a week.       Psychiatric Specialty Exam:  Presentation  General Appearance:  Appropriate for Environment; Casual  Eye Contact: Fair  Speech: Clear and Coherent  Speech Volume: Decreased    Mood and Affect  Mood: Dysphoric; Hopeless  Affect: Depressed; Flat   Thought Process  Thought Processes: Irrevelant  Descriptions of Associations:Intact  Orientation:Full (Time,  Place and Person)  Thought Content:Illogical  Hallucinations:Hallucinations: Auditory  Ideas of Reference:Paranoia  Suicidal Thoughts:Suicidal Thoughts: Yes, Passive SI Passive Intent and/or Plan: Without Intent; Without Plan  Homicidal Thoughts:Homicidal Thoughts: No   Sensorium  Memory: Immediate Fair; Recent Fair; Remote Poor  Judgment: Impaired  Insight: Shallow   Executive Functions  Concentration: Poor  Attention Span: Poor  Recall: Fair  Fund of Knowledge: Poor  Language: Fair   Psychomotor Activity  Psychomotor Activity: Psychomotor Activity: Normal   Assets  Assets: Physical Health; Resilience; Social Support    Musculoskeletal: Strength & Muscle Tone: within normal limits Gait & Station: normal  Physical Exam: Physical Exam Vitals and nursing note reviewed.  HENT:     Head: Normocephalic.     Nose: Nose normal.  Cardiovascular:     Rate and Rhythm: Normal rate.     Pulses: Normal pulses.  Pulmonary:     Breath sounds: Normal breath sounds.  Neurological:     Mental Status: He is alert.    Review of Systems  Constitutional: Negative.   HENT: Negative.    Eyes: Negative.   Skin: Negative.    Blood pressure (!) 123/97, pulse 89, temperature 97.9 F (36.6 C), resp. rate 19, height 5' 8 (1.727 m), weight 102.1 kg, SpO2 100%. Body mass index is 34.21 kg/m.  Principal Diagnosis: MDD (major depressive disorder) Diagnosis:  Principal Problem:   MDD (major depressive disorder)   Clinical Decision Making: Patient with history of schizoaffective disorder brought into the emergency room after a lethal overdose on gabapentin as a suicide attempt in the context of worsening depression and hallucinations command type.  He needs ongoing hospitalization for further stabilization.  Treatment Plan Summary:  Safety and Monitoring:             -- Voluntary admission to inpatient psychiatric unit for safety, stabilization and treatment              -- Daily contact with patient to assess and evaluate symptoms and progress in treatment             -- Patient's case to be discussed in multi-disciplinary team meeting             -- Observation Level: q15 minute checks             -- Vital signs:  q12 hours             -- Precautions: suicide, elopement, and assault   2. Psychiatric Diagnoses and Treatment:               Discussed starting patient on Zoloft 50 mg daily and Abilify 5 mg nightly to help with hallucinations and depression.  Patient gave consent to the medications   -- The risks/benefits/side-effects/alternatives to this medication were discussed in detail with the patient and time was given for questions. The patient consents to medication trial.                -- Metabolic profile and EKG monitoring obtained while on an atypical antipsychotic (BMI: Lipid Panel: HbgA1c: QTc:)              -- Encouraged patient to participate in unit milieu and in scheduled group therapies                            3. Medical Issues Being Addressed:      4. Discharge Planning:              -- Social work and case management to assist with discharge planning and identification of hospital follow-up needs prior to discharge             -- Estimated LOS: 5-7 days             -- Discharge Concerns: Need to establish a safety plan; Medication compliance and effectiveness             --  Discharge Goals: Return home with outpatient referrals follow ups  Physician Treatment Plan for Primary Diagnosis: MDD (major depressive disorder) Long Term Goal(s): Improvement in symptoms so as ready for discharge  Short Term Goals: Ability to identify changes in lifestyle to reduce recurrence of condition will improve, Ability to verbalize feelings will improve, Ability to disclose and discuss suicidal ideas, Ability to demonstrate self-control will improve, Ability to identify and develop effective coping behaviors will improve, and Ability to  maintain clinical measurements within normal limits will improve  Physician Treatment Plan for Secondary Diagnosis: Principal Problem:   MDD (major depressive disorder)  Long Term Goal(s): Improvement in symptoms so as ready for discharge  Short Term Goals: Ability to identify changes in lifestyle to reduce recurrence of condition will improve, Ability to verbalize feelings will improve, Ability to disclose and discuss suicidal ideas, Ability to demonstrate self-control will improve, and Ability to identify and develop effective coping behaviors will improve  I certify that inpatient services furnished can reasonably be expected to improve the patient's condition.    Chasitee Zenker, MD 7/2/202510:30 PM

## 2023-11-10 NOTE — Group Note (Signed)
 [facility] LCSW Group Therapy Note   Group Date: 11/10/2023 Start Time: 1300 End Time: 1330   Type of Therapy/Topic:  Group Therapy:  Emotion Regulation  Participation Level:  Did Not Attend    Description of Group:    The purpose of this group is to assist patients in learning to regulate negative emotions and experience positive emotions. Patients will be guided to discuss ways in which they have been vulnerable to their negative emotions. These vulnerabilities will be juxtaposed with experiences of positive emotions or situations, and patients challenged to use positive emotions to combat negative ones. Special emphasis will be placed on coping with negative emotions in conflict situations, and patients will process healthy conflict resolution skills.  Therapeutic Goals: Patient will identify two positive emotions or experiences to reflect on in order to balance out negative emotions:  Patient will label two or more emotions that they find the most difficult to experience:  Patient will be able to demonstrate positive conflict resolution skills through discussion or role plays:   Summary of Patient Progress: Patient did not attend group.     Therapeutic Modalities:   Cognitive Behavioral Therapy Feelings Identification Dialectical Behavioral Therapy   [name], LCSW

## 2023-11-10 NOTE — Plan of Care (Signed)
  Problem: Education: Goal: Knowledge of [facility] Education information/materials will improve Outcome: Progressing Goal: Emotional status will improve Outcome: Progressing Goal: Mental status will improve Outcome: Progressing Goal: Verbalization of understanding the information provided will improve Outcome: Progressing   Problem: Activity: Goal: Interest or engagement in activities will improve Outcome: Progressing Goal: Sleeping patterns will improve Outcome: Progressing   Problem: Coping: Goal: Ability to verbalize frustrations and anger appropriately will improve Outcome: Progressing Goal: Ability to demonstrate self-control will improve Outcome: Progressing   Problem: Health Behavior/Discharge Planning: Goal: Identification of resources available to assist in meeting health care needs will improve Outcome: Progressing Goal: Compliance with treatment plan for underlying cause of condition will improve Outcome: Progressing   Problem: Physical Regulation: Goal: Ability to maintain clinical measurements within normal limits will improve Outcome: Progressing   Problem: Safety: Goal: Periods of time without injury will increase Outcome: Progressing   Problem: Self-Concept: Goal: Ability to modify response to factors that promote anxiety will improve Outcome: Progressing   Problem: Education: Goal: Knowledge of [facility] [facility] Education information/materials will improve Outcome: Progressing   Problem: Coping: Goal: Coping ability will improve Outcome: Progressing Goal: Will verbalize feelings Outcome: Progressing   Problem: Coping: Goal: Coping ability will improve Outcome: Progressing   Problem: Health Behavior/Discharge Planning: Goal: Identification of resources available to assist in meeting health care needs will improve Outcome: Progressing

## 2023-11-10 NOTE — [facility] Suicide Risk Assessment (Signed)
 [facility] Admission Suicide Risk Assessment   Nursing information obtained from:  Patient Demographic factors:  Male, Living alone, Low socioeconomic status Current Mental Status:  NA Loss Factors:  Legal issues, Financial problems / change in socioeconomic status Historical Factors:  Impulsivity Risk Reduction Factors:  Employed  Total Time spent with patient: 30 minutes Principal Problem: MDD (major depressive disorder) Diagnosis:  Principal Problem:   MDD (major depressive disorder)  Subjective Data: Patient is a 38 year old African-American male with history of schizoaffective disorder presented to the emergency room after reported overdose on gabapentin pills as an attempt of suicide in the context of being overwhelmed with multiple thoughts and alcohol intake. Patient is admitted to adult psych unit with Q15 min safety monitoring. Multidisciplinary team approach is offered. Medication management; group/milieu therapy is offered.   Continued Clinical Symptoms:  Alcohol Use Disorder Identification Test Final Score (AUDIT): 7 The Alcohol Use Disorders Identification Test, Guidelines for Use in Primary Care, Second Edition.  World Science writer [facility]). Score between 0-7:  no or low risk or alcohol related problems. Score between 8-15:  moderate risk of alcohol related problems. Score between 16-19:  high risk of alcohol related problems. Score 20 or above:  warrants further diagnostic evaluation for alcohol dependence and treatment.   CLINICAL FACTORS:   Schizophrenia:   Paranoid or undifferentiated type   Musculoskeletal: Strength & Muscle Tone: within normal limits Gait & Station: normal Patient leans: N/A  Psychiatric Specialty Exam:  Presentation  General Appearance:  Appropriate for Environment; Casual  Eye Contact: Fair  Speech: Clear and Coherent  Speech Volume: Decreased  Handedness: Right   Mood and Affect  Mood: Dysphoric;  Hopeless  Affect: Depressed; Flat   Thought Process  Thought Processes: Irrevelant  Descriptions of Associations:Intact  Orientation:Full (Time, Place and Person)  Thought Content:Illogical  History of Schizophrenia/Schizoaffective disorder:No  Duration of Psychotic Symptoms:No data recorded Hallucinations:Hallucinations: Auditory  Ideas of Reference:Paranoia  Suicidal Thoughts:Suicidal Thoughts: Yes, Passive SI Passive Intent and/or Plan: Without Intent; Without Plan  Homicidal Thoughts:Homicidal Thoughts: No   Sensorium  Memory: Immediate Fair; Recent Fair; Remote Poor  Judgment: Impaired  Insight: Shallow   Executive Functions  Concentration: Poor  Attention Span: Poor  Recall: Fair  Fund of Knowledge: Poor  Language: Fair   Psychomotor Activity  Psychomotor Activity: Psychomotor Activity: Normal   Assets  Assets: Physical Health; Resilience; Social Support   Sleep  Sleep: Sleep: Fair    Physical Exam: Physical Exam Vitals and nursing note reviewed.    ROS Blood pressure (!) 123/97, pulse 89, temperature 97.9 F (36.6 C), resp. rate 19, height 5' 8 (1.727 m), weight 102.1 kg, SpO2 100%. Body mass index is 34.21 kg/m.   COGNITIVE FEATURES THAT CONTRIBUTE TO RISK:  None    SUICIDE RISK:   Minimal: No identifiable suicidal ideation.  Patients presenting with no risk factors but with morbid ruminations; may be classified as minimal risk based on the severity of the depressive symptoms  PLAN OF CARE: Patient is admitted to adult psych unit with Q15 min safety monitoring. Multidisciplinary team approach is offered. Medication management; group/milieu therapy is offered.   I certify that inpatient services furnished can reasonably be expected to improve the patient's condition.   Allyn Foil, MD 11/10/2023, 10:28 PM

## 2023-11-10 NOTE — Group Note (Signed)
 Date:  11/10/2023 Time:  3:41 PM  Group Topic/Focus:  Wellness Toolbox:   The focus of this group is to discuss various aspects of wellness, balancing those aspects and exploring ways to increase the ability to experience wellness.  Patients will create a wellness toolbox for use upon discharge.    Participation Level:  Did Not Attend    Craig Fischer 11/10/2023, 3:41 PM

## 2023-11-11 DIAGNOSIS — F251 Schizoaffective disorder, depressive type: Secondary | ICD-10-CM

## 2023-11-11 NOTE — Plan of Care (Signed)
  Problem: Education: Goal: Knowledge of [facility] Education information/materials will improve Outcome: Not Progressing Goal: Emotional status will improve Outcome: Not Progressing Goal: Mental status will improve Outcome: Not Progressing Goal: Verbalization of understanding the information provided will improve Outcome: Not Progressing   Problem: Activity: Goal: Interest or engagement in activities will improve Outcome: Not Progressing Goal: Sleeping patterns will improve Outcome: Not Progressing   Problem: Coping: Goal: Ability to verbalize frustrations and anger appropriately will improve Outcome: Not Progressing Goal: Ability to demonstrate self-control will improve Outcome: Not Progressing   Problem: Health Behavior/Discharge Planning: Goal: Identification of resources available to assist in meeting health care needs will improve Outcome: Not Progressing Goal: Compliance with treatment plan for underlying cause of condition will improve Outcome: Not Progressing   Problem: Physical Regulation: Goal: Ability to maintain clinical measurements within normal limits will improve Outcome: Not Progressing   Problem: Safety: Goal: Periods of time without injury will increase Outcome: Not Progressing   Problem: Self-Concept: Goal: Ability to modify response to factors that promote anxiety will improve Outcome: Not Progressing   Problem: Education: Goal: Knowledge of [facility] Education information/materials will improve Outcome: Not Progressing   Problem: Coping: Goal: Coping ability will improve Outcome: Not Progressing Goal: Will verbalize feelings Outcome: Not Progressing   Problem: Coping: Goal: Coping ability will improve Outcome: Not Progressing   Problem: Health Behavior/Discharge Planning: Goal: Identification of resources available to assist in meeting health care needs will improve Outcome: Not Progressing

## 2023-11-11 NOTE — Progress Notes (Signed)
   11/11/23 2022  Psych Admission Type (Psych Patients Only)  Admission Status Voluntary  Psychosocial Assessment  Patient Complaints Anxiety;Depression  Eye Contact Fair  Facial Expression Anxious  Affect Appropriate to circumstance  Speech Soft  Interaction Avoidant;Isolative  Motor Activity Slow  Appearance/Hygiene Poor hygiene;In scrubs  Behavior Characteristics Cooperative;Appropriate to situation  Aggressive Behavior  Effect No apparent injury  Thought Process  Coherency WDL  Content WDL  Delusions None reported or observed  Perception WDL  Hallucination None reported or observed  Judgment Poor  Confusion None  Danger to Self  Current suicidal ideation? Denies  Danger to Others  Danger to Others None reported or observed

## 2023-11-11 NOTE — Group Note (Signed)
 Recreation Therapy Group Note   Group Topic:Healthy Support Systems  Group Date: 11/11/2023 Start Time: 1530 End Time: 1620 Facilitators: Celestia Jeoffrey FORBES ARTICE, CTRS Location: Craft Room  Group Description: Straw Bridge. In groups or individually, patients were given 10 plastic drinking straws and an equal length of masking tape. Using the materials provided, patients were instructed to build a free-standing bridge-like structure to suspend an everyday item (ex: deck of cards) off the floor or table surface. All materials were required to be used in Secondary school teacher. LRT facilitated post-activity discussion reviewing the importance of having strong and healthy support systems in our lives. LRT discussed how the people in our lives serve as the tape and the deck of cards we placed on top of our straw structure are the stressors we face in daily life. LRT and pts discussed what happens in our life when things get too heavy for us , and we don't have strong supports outside of the hospital. Pt shared 2 of their healthy supports in their life aloud in the group.   Goal Area(s) Addressed:  Patient will identify 2 healthy supports in their life. Patient will identify skills to successfully complete activity. Patient will identify correlation of this activity to life post-discharge.  Patient will build on frustration tolerance skills. Patient will increase team building and communication skills.    Affect/Mood: N/A   Participation Level: Did not attend    Clinical Observations/Individualized Feedback: Patient did not attend group.   Plan: Continue to engage patient in RT group sessions 2-3x/week.   [address], LRT, CTRS 11/11/2023 5:27 PM

## 2023-11-11 NOTE — Group Note (Signed)
 Date:  11/11/2023 Time:  9:49 PM  Group Topic/Focus:  Wrap-Up Group:   The focus of this group is to help patients review their daily goal of treatment and discuss progress on daily workbooks.    Participation Level:  Did Not Attend   Craig Fischer 11/11/2023, 9:49 PM

## 2023-11-11 NOTE — Group Note (Signed)
 [facility] LCSW Group Therapy Note   Group Date: 11/11/2023 Start Time: 1300 End Time: 1400   Type of Therapy/Topic:  Group Therapy:  Balance in Life  Participation Level:  Did Not Attend   Description of Group:    This group will address the concept of balance and how it feels and looks when one is unbalanced. Patients will be encouraged to process areas in their lives that are out of balance, and identify reasons for remaining unbalanced. Facilitators will guide patients utilizing problem- solving interventions to address and correct the stressor making their life unbalanced. Understanding and applying boundaries will be explored and addressed for obtaining  and maintaining a balanced life. Patients will be encouraged to explore ways to assertively make their unbalanced needs known to significant others in their lives, using other group members and facilitator for support and feedback.  Therapeutic Goals: Patient will identify two or more emotions or situations they have that consume much of in their lives. Patient will identify signs/triggers that life has become out of balance:  Patient will identify two ways to set boundaries in order to achieve balance in their lives:  Patient will demonstrate ability to communicate their needs through discussion and/or role plays  Summary of Patient Progress: Patient declined to attend group  Therapeutic Modalities:   Cognitive Behavioral Therapy Solution-Focused Therapy Assertiveness Training   Sherryle JINNY Margo, LCSW

## 2023-11-11 NOTE — Plan of Care (Signed)
  Problem: Education: Goal: Knowledge of [facility] Education information/materials will improve Outcome: Progressing Goal: Emotional status will improve Outcome: Progressing Goal: Mental status will improve Outcome: Progressing Goal: Verbalization of understanding the information provided will improve Outcome: Progressing   Problem: Activity: Goal: Interest or engagement in activities will improve Outcome: Progressing Goal: Sleeping patterns will improve Outcome: Progressing   Problem: Coping: Goal: Ability to verbalize frustrations and anger appropriately will improve Outcome: Progressing Goal: Ability to demonstrate self-control will improve Outcome: Progressing   Problem: Health Behavior/Discharge Planning: Goal: Identification of resources available to assist in meeting health care needs will improve Outcome: Progressing Goal: Compliance with treatment plan for underlying cause of condition will improve Outcome: Progressing   Problem: Physical Regulation: Goal: Ability to maintain clinical measurements within normal limits will improve Outcome: Progressing   Problem: Safety: Goal: Periods of time without injury will increase Outcome: Progressing   Problem: Self-Concept: Goal: Ability to modify response to factors that promote anxiety will improve Outcome: Progressing   Problem: Education: Goal: Knowledge of [facility] [facility] Education information/materials will improve Outcome: Progressing   Problem: Coping: Goal: Coping ability will improve Outcome: Progressing Goal: Will verbalize feelings Outcome: Progressing   Problem: Coping: Goal: Coping ability will improve Outcome: Progressing   Problem: Health Behavior/Discharge Planning: Goal: Identification of resources available to assist in meeting health care needs will improve Outcome: Progressing

## 2023-11-11 NOTE — Progress Notes (Signed)
 Pt in bed on engagement, reports that he is here because of his family and girlfriend.  He states, "My family wants too much out of me; and my girlfriend threatened to break up with me."  When asked what he wishes to accomplish during this admission Pt explained, "I want to get myself together. I want a better life."  He endorsed depression rated, 5/10 anxiety rated 3/10 and denied SI/HI, and AVH.    11/10/23 2300  Psych Admission Type (Psych Patients Only)  Admission Status Voluntary  Psychosocial Assessment  Patient Complaints Anxiety;Depression  Eye Contact Fair  Facial Expression Anxious  Affect Appropriate to circumstance  Speech Soft  Interaction Isolative  Motor Activity Slow  Appearance/Hygiene In scrubs  Behavior Characteristics Cooperative;Appropriate to situation  Mood Pleasant  Aggressive Behavior  Effect No apparent injury  Thought Process  Coherency WDL  Content WDL  Delusions None reported or observed  Perception WDL  Hallucination None reported or observed  Judgment WDL  Confusion None  Danger to Self  Current suicidal ideation? Denies  Danger to Others  Danger to Others None reported or observed    Problem: Education: Goal: Knowledge of [facility] Education information/materials will improve Outcome: Progressing Goal: Emotional status will improve Outcome: Progressing Goal: Mental status will improve Outcome: Progressing Goal: Verbalization of understanding the information provided will improve Outcome: Progressing   Problem: Activity: Goal: Interest or engagement in activities will improve Outcome: Progressing Goal: Sleeping patterns will improve Outcome: Progressing   Problem: Coping: Goal: Ability to verbalize frustrations and anger appropriately will improve Outcome: Progressing Goal: Ability to demonstrate self-control will improve Outcome: Progressing

## 2023-11-11 NOTE — Progress Notes (Signed)
 [facility] MD Progress Note  11/11/2023 1:56 PM Craig Fischer  MRN:  [id number]  Patient is a 38 year old African-American male with history of schizoaffective disorder presented to the emergency room after reported overdose on gabapentin pills as an attempt of suicide in the context of being overwhelmed with multiple thoughts and alcohol intake. Patient is admitted to adult psych unit with Q15 min safety monitoring. Multidisciplinary team approach is offered. Medication management; group/milieu therapy is offered.   Subjective:  Chart reviewed, case discussed in multidisciplinary meeting, patient seen during rounds.  Patient is noted to be on phone for a long time but agreed to come and talk to the provider.  He reports that he was talking to his girlfriend and he is noted to be visibly happy.  He reports feeling better and wanted to know about his discharge planning.  He reports that the hallucinations are a lot better and they are not command type.  He denies any anxiety attacks today.  He denies SI/HI/plan.  He is taking his medications and denies having any side effects.   Sleep: Fair  Appetite:  Fair  Past Psychiatric History: see h&P Family History: History reviewed. No pertinent family history. Social History:  Social History   Substance and Sexual Activity  Alcohol Use Not Currently     Social History   Substance and Sexual Activity  Drug Use No    Social History   Socioeconomic History   Marital status: Single    Spouse name: Not on file   Number of children: Not on file   Years of education: Not on file   Highest education level: Not on file  Occupational History   Not on file  Tobacco Use   Smoking status: Former    Types: Cigarettes   Smokeless tobacco: Never  Substance and Sexual Activity   Alcohol use: Not Currently   Drug use: No   Sexual activity: Yes  Other Topics Concern   Not on file  Social History Narrative   Not on file   Social Drivers of Health    Financial Resource Strain: Not on file  Food Insecurity: Food Insecurity Present (11/09/2023)   Hunger Vital Sign    Worried About Running Out of Food in the Last Year: Sometimes true    Ran Out of Food in the Last Year: Sometimes true  Transportation Needs: No Transportation Needs (11/09/2023)   PRAPARE - Administrator, Civil Service (Medical): No    Lack of Transportation (Non-Medical): No  Physical Activity: Not on file  Stress: Not on file  Social Connections: Not on file   Past Medical History:  Past Medical History:  Diagnosis Date   Asthma    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, unsp (HCC)    Diabetes mellitus without complication (HCC)    High cholesterol    Hypertension    Thyroid  disease     Past Surgical History:  Procedure Laterality Date   goiter     removal    Current Medications: Current Facility-Administered Medications  Medication Dose Route Frequency Provider Last Rate Last Admin   acetaminophen  (TYLENOL ) tablet 650 mg  650 mg Oral Q6H PRN Mardy Legacy, NP       alum & mag hydroxide-simeth (MAALOX/MYLANTA) 200-200-20 MG/5ML suspension 30 mL  30 mL Oral Q4H PRN Mardy Legacy, NP       ARIPiprazole (ABILIFY) tablet 5 mg  5 mg Oral Daily Brittlyn Cloe, MD   5 mg  at 11/11/23 0908   gabapentin (NEURONTIN) capsule 100 mg  100 mg Oral TID Sreshta Cressler, MD   100 mg at 11/11/23 1137   hydrOXYzine (ATARAX) tablet 25 mg  25 mg Oral TID PRN Mardy Legacy, NP       levothyroxine (SYNTHROID) tablet 175 mcg  175 mcg Oral Q0600 Jarrod Bodkins, MD   175 mcg at 11/11/23 9388   And   levothyroxine (SYNTHROID) tablet 100 mcg  100 mcg Oral Q0600 Kandas Oliveto, MD   100 mcg at 11/11/23 0617   magnesium hydroxide (MILK OF MAGNESIA) suspension 30 mL  30 mL Oral Daily PRN Mardy Legacy, NP       OLANZapine (ZYPREXA) injection 10 mg  10 mg Intramuscular TID PRN Mardy Legacy, NP       OLANZapine (ZYPREXA) injection 5 mg  5 mg  Intramuscular TID PRN Mardy Legacy, NP       OLANZapine zydis (ZYPREXA) disintegrating tablet 5 mg  5 mg Oral TID PRN Mardy Legacy, NP       sertraline (ZOLOFT) tablet 50 mg  50 mg Oral Daily Saloni Lablanc, MD   50 mg at 11/11/23 0908   traZODone (DESYREL) tablet 50 mg  50 mg Oral QHS PRN Mardy Legacy, NP        Lab Results: No results found for this or any previous visit (from the past 48 hours).  Blood Alcohol level:  Lab Results  Component Value Date   ETH 220 (H) 11/09/2023   ETH 255 (H) 02/01/2016    Metabolic Disorder Labs: Lab Results  Component Value Date   HGBA1C (H) 03/14/2010    15.4 (NOTE)                                                                       According to the ADA Clinical Practice Recommendations for 2011, when HbA1c is used as a screening test:   >=6.5%   Diagnostic of Diabetes Mellitus           (if abnormal result  is confirmed)  5.7-6.4%   Increased risk of developing Diabetes Mellitus  References:Diagnosis and Classification of Diabetes Mellitus,Diabetes Care,2011,34(Suppl 1):S62-S69 and Standards of Medical Care in         Diabetes - 2011,Diabetes Care,2011,34  (Suppl 1):S11-S61.   MPG 395 (H) 03/14/2010   No results found for: PROLACTIN No results found for: CHOL, TRIG, HDL, CHOLHDL, VLDL, LDLCALC  Physical Findings: AIMS:  , ,  ,  ,    CIWA:    COWS:      Psychiatric Specialty Exam:  Presentation  General Appearance:  Appropriate for Environment; Casual  Eye Contact: Fair  Speech: Clear and Coherent  Speech Volume: Decreased    Mood and Affect  Mood: fine  Affect: euthymic   Thought Process  Thought Processes: concrete  Descriptions of Associations:Intact  Orientation:Full (Time, Place and Person)  Thought Content:Illogical  Hallucinations:Hallucinations: Auditory  Ideas of Reference:Paranoia  Suicidal Thoughts:Suicidal Thoughts: Yes, Passive SI Passive Intent and/or Plan: Without  Intent; Without Plan  Homicidal Thoughts:Homicidal Thoughts: No   Sensorium  Memory: Immediate Fair; Recent Fair; Remote Poor  Judgment: Impaired  Insight: Shallow   Executive Functions  Concentration: Poor  Attention Span: Poor  Recall:  Fair  Fund of Knowledge: Poor  Language: Fair   Lexicographer Activity: Psychomotor Activity: Normal  Musculoskeletal: Strength & Muscle Tone: within normal limits Gait & Station: normal Assets  Assets: Physical Health; Resilience; Social Support    Physical Exam: Physical Exam Vitals and nursing note reviewed.  HENT:     Head: Normocephalic.     Nose: Nose normal.  Neurological:     Mental Status: He is alert.    Review of Systems  Constitutional: Negative.   HENT: Negative.    Eyes: Negative.   Cardiovascular: Negative.   Skin: Negative.    Blood pressure (!) 123/97, pulse 89, temperature 97.9 F (36.6 C), resp. rate 19, height 5' 8 (1.727 m), weight 102.1 kg, SpO2 100%. Body mass index is 34.21 kg/m.  Diagnosis: Principal Problem:   MDD (major depressive disorder)  Clinical Decision Making: Patient with history of schizoaffective disorder brought into the emergency room after a lethal overdose on gabapentin as a suicide attempt in the context of worsening depression and hallucinations command type.  He needs ongoing hospitalization for further stabilization.   Treatment Plan Summary:   Safety and Monitoring:             -- Voluntary admission to inpatient psychiatric unit for safety, stabilization and treatment             -- Daily contact with patient to assess and evaluate symptoms and progress in treatment             -- Patient's case to be discussed in multi-disciplinary team meeting             -- Observation Level: q15 minute checks             -- Vital signs:  q12 hours             -- Precautions: suicide, elopement, and assault   2. Psychiatric Diagnoses and Treatment:                 Zoloft 50 mg daily and Abilify 5 mg nightly to help with hallucinations and depression.  Patient gave consent to the medications   -- The risks/benefits/side-effects/alternatives to this medication were discussed in detail with the patient and time was given for questions. The patient consents to medication trial.                -- Metabolic profile and EKG monitoring obtained while on an atypical antipsychotic (BMI: Lipid Panel: HbgA1c: QTc:)              -- Encouraged patient to participate in unit milieu and in scheduled group therapies                            3. Medical Issues Being Addressed:   4. Discharge Planning:   -- Social work and case management to assist with discharge planning and identification of hospital follow-up needs prior to discharge  -- Estimated LOS: 3-4 days  Allyn Foil, MD 11/11/2023, 1:56 PM

## 2023-11-11 NOTE — Progress Notes (Signed)
   11/11/23 0905  Psych Admission Type (Psych Patients Only)  Admission Status Voluntary  Psychosocial Assessment  Patient Complaints Anxiety;Depression  Eye Contact Fair  Facial Expression Anxious  Affect Appropriate to circumstance  Speech Soft  Interaction Isolative;Avoidant  Motor Activity Slow  Appearance/Hygiene Poor hygiene;In scrubs  Behavior Characteristics Cooperative;Appropriate to situation  Mood Pleasant  Aggressive Behavior  Effect No apparent injury  Thought Process  Coherency WDL  Content WDL  Delusions None reported or observed  Perception WDL  Hallucination None reported or observed (was hesitant to answer)  Judgment Poor  Confusion None  Danger to Self  Current suicidal ideation? Denies  Danger to Others  Danger to Others None reported or observed

## 2023-11-11 NOTE — [facility] Counselor (Signed)
 Adult Comprehensive Assessment  Patient ID: Craig Fischer, male   DOB: [birth year], 38 y.o.   MRN: [id number]  Information Source: Information source: Patient  Current Stressors:  Patient states their primary concerns and needs for treatment are:: Lost my mind the other day. He endorsed some SI, HI, and alcohol use. Patient states their goals for this hospitilization and ongoing recovery are:: To get better so I can go home and start attending AA meetings. Educational / Learning stressors: None reported Employment / Job issues: None reported Family Relationships: He reported some issues with relationships. Financial / Lack of resources (include bankruptcy): None reported Housing / Lack of housing: None reported Physical health (include injuries & life threatening diseases): None reported Social relationships: None reported Substance abuse: He reported some alcohol use. Bereavement / Loss: None reported  Living/Environment/Situation:  Living Arrangements: Alone Living conditions (as described by patient or guardian): Got my own place. Who else lives in the home?: Pt reported that he lives by himself. How long has patient lived in current situation?: On August 3rd be going on eight years. What is atmosphere in current home: Comfortable (Ok.)  Family History:  Marital status: Long term relationship Long term relationship, how long?: Eight years on June 9th. What types of issues is patient dealing with in the relationship?: More time together, more communication. Are you sexually active?: Yes What is your sexual orientation?: Straight Does patient have children?: No  Childhood History:  By whom was/is the patient raised?: Mother Additional childhood history information: It was ok. He reported dad left when pt was two but came back in my teens. Description of patient's relationship with caregiver when they were a child: It was aight. Patient's description of current  relationship with people who raised him/her: Pretty good actually. How were you disciplined when you got in trouble as a child/adolescent?: With a belt. Does patient have siblings?: Yes Number of Siblings: 4 (Pt has three younger sisters and a younger brother. Two of the sisters are on his dad's side) Description of patient's current relationship with siblings: Pretty good. Did patient suffer any verbal/emotional/physical/sexual abuse as a child?: No Did patient suffer from severe childhood neglect?: No Has patient ever been sexually abused/assaulted/raped as an adolescent or adult?: No Was the patient ever a victim of a crime or a disaster?: No Witnessed domestic violence?: No Has patient been affected by domestic violence as an adult?: No  Education:  Highest grade of school patient has completed: High school graduate Currently a student?: No Learning disability?: Yes What learning problems does patient have?: I was kinda in the slow classes.  Employment/Work Situation:   Employment Situation: On disability Why is Patient on Disability: Mental health How Long has Patient Been on Disability: Maybe since 2010. Patient's Job has Been Impacted by Current Illness: No What is the Longest Time Patient has Held a Job?: Three years and something. 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?: No  Financial Resources:   Financial resources: [employer], [employer], [employer], Medicaid Does patient have a Lawyer or guardian?: Yes Name of representative payee or guardian: Payee, mother, Berwyn Gearing  Alcohol/Substance Abuse:   What has been your use of drugs/alcohol within the last 12 months?: Pt reported that he drinks 2-3 times a week. Half a glass of vodka and orange juice and have a beer. If attempted suicide, did drugs/alcohol play a role in this?: Yes (A long time  ago- 15 years.) Alcohol/Substance  Abuse Treatment Hx: Denies past history Has alcohol/substance abuse ever caused legal problems?: Yes (DUI)  Social Support System:   Patient's Community Support System: Good Describe Community Support System: My girlfriend, my mom, my sister, my dad, cousins, aunts, uncles. Type of faith/religion: Sherlean How does patient's faith help to cope with current illness?: Try to stay in the bible, try to go to church too.  Leisure/Recreation:   Do You Have Hobbies?: Yes Leisure and Hobbies: I used to could draw real good. Mostly landscaping, doing yardwork. Spending time with my girlfriend, going out to eat, different things like that.  Strengths/Needs:   What is the patient's perception of their strengths?: I'm good at driving, the work that I do, a good friend, helping others. Also good at car stereo equiment, like doing stuff like that too. Patient states these barriers may affect/interfere with their treatment: Pt denied any barriers. Patient states these barriers may affect their return to the community: Pt denied any barriers.  Discharge Plan:   Currently receiving community mental health services: No Patient states concerns and preferences for aftercare planning are: Pt is open to referral for outpatient services. Patient states they will know when they are safe and ready for discharge when: I think I'll be ok, safe and ready. I got a good support system. Does patient have access to transportation?: Yes Does patient have financial barriers related to discharge medications?: No Will patient be returning to same living situation after discharge?: Yes  Summary/Recommendations:   Summary and Recommendations (to be completed by the evaluator): Patient is a 38 year old, single but partnered, male from [place], [place] [facility] He reported that he came into the hospital after attempting to end his life by overdosing on gabapentin. Pt was also drinking at the time and  endorsed some HI as well. Per chart review pt reported to have drunk 10-15 shots of vodka. He expressed his goal for hospitalization as "to get better" so he can discharge and return to [employer]. Pt lives at home by himself and reported plans to return home upon discharge. He shared some interpersonal relationship issues going on with his girlfriend but only states that they need to spend more time together and communicate more. Pt denied any history of violence. He reported alcohol use but appeared to be minimizing his use. He reported drinking two to three times a week and only shared drinking a mixed drink (half vodka/half orange juice) and a beer. However, pt does acknowledge that he has a DUI. Pt reported a history of suicide attempt while under the influence with last incident being 15 years ago. He receives disability, has both Medicaid and Medicare, and gets food stamps. Pt is not connected with any mental health provider currently but is open to referral upon discharge. Recommendations include crisis stabilization, therapeutic milieu, encourage group attendance and participation, medication management for mood stabilization and development of a comprehensive mental wellness plan.  [name]. 11/11/2023

## 2023-11-11 NOTE — [facility] Suicide Risk Assessment (Signed)
 [facility] INPATIENT:  Family/Significant Other Suicide Prevention Education  Suicide Prevention Education:  Contact Attempts: [name], [phone], Mother, has been identified by the patient as the family member/significant other with whom the patient will be residing, and identified as the person(s) who will aid the patient in the event of a mental health crisis.  With written consent from the patient, two attempts were made to provide suicide prevention education, prior to and/or following the patient's discharge.  We were unsuccessful in providing suicide prevention education.  A suicide education pamphlet was given to the patient to share with family/significant other.  Date and time of first attempt:11/11/23 at 10:50 AM  Date and time of second attempt: Second attempt is needed.   [name] M [name] 11/11/2023, 10:50 AM

## 2023-11-11 NOTE — [facility] Suicide Risk Assessment (Signed)
 [facility] INPATIENT:  Family/Significant Other Suicide Prevention Education  Suicide Prevention Education:  Patient Refusal for Family/Significant Other Suicide Prevention Education: The patient Craig Fischer has refused to provide written consent for family/significant other to be provided Family/Significant Other Suicide Prevention Education during admission and/or prior to discharge.  Physician notified.  SPE completed with pt, as pt refused to consent to family contact. SPI pamphlet provided to pt and pt was encouraged to share information with support network, ask questions, and talk about any concerns relating to SPE. Pt denies access to guns/firearms and verbalized understanding of information provided. Mobile Crisis information also provided to pt.  [name] 11/11/2023, 8:49 AM

## 2023-11-11 NOTE — Group Note (Signed)
 Recreation Therapy Group Note   Group Topic:Health and Wellness  Group Date: 11/11/2023 Start Time: 0950 End Time: 1050 Facilitators: Celestia Jeoffrey BRAVO, LRT, CTRS Location: Courtyard  Group Description: [employer]. LRT and patients played games of basketball, drew with chalk, and played corn hole while outside in the courtyard while getting fresh air and sunlight. Music was being played in the background. LRT and peers conversed about different games they have played before, what they do in their free time and anything else that is on their minds. LRT encouraged pts to drink water after being outside, sweating and getting their heart rate up.  Goal Area(s) Addressed: Patient will build on frustration tolerance skills. Patients will partake in a competitive play game with peers. Patients will gain knowledge of new leisure interest/hobby.    Affect/Mood: N/A   Participation Level: Did not attend    Clinical Observations/Individualized Feedback: Patient did not attend group.   Plan: Continue to engage patient in RT group sessions 2-3x/week.   Jeoffrey BRAVO Celestia, LRT, CTRS 11/11/2023 11:13 AM

## 2023-11-11 NOTE — Group Note (Signed)
 Date:  11/11/2023 Time:  11:11 AM  Group Topic/Focus:  Self Care:   The focus of this group is to help patients understand the importance of self-care in order to improve or restore emotional, physical, spiritual, interpersonal, and financial health.    Participation Level:  Did Not Attend   Camellia HERO Jannis Atkins 11/11/2023, 11:11 AM

## 2023-11-12 DIAGNOSIS — F251 Schizoaffective disorder, depressive type: Secondary | ICD-10-CM | POA: Diagnosis not present

## 2023-11-12 LAB — HEMOGLOBIN A1C
Hgb A1c MFr Bld: 6.5 % — ABNORMAL HIGH (ref 4.8–5.6)
Mean Plasma Glucose: 139.85 mg/dL

## 2023-11-12 LAB — LIPID PANEL
Cholesterol: 117 mg/dL (ref 0–200)
HDL: 38 mg/dL — ABNORMAL LOW (ref >40)
LDL Cholesterol: 69 mg/dL (ref 0–99)
Total CHOL/HDL Ratio: 3.1 ratio
Triglycerides: 51 mg/dL (ref <150)
VLDL: 10 mg/dL (ref 0–40)

## 2023-11-12 LAB — GLUCOSE, CAPILLARY: Glucose-Capillary: 194 mg/dL — ABNORMAL HIGH (ref 70–99)

## 2023-11-12 MED ORDER — HYDRALAZINE HCL 10 MG PO TABS
10.0000 mg | ORAL_TABLET | Freq: Three times a day (TID) | ORAL | Status: DC
  Administered 2023-11-12 – 2023-11-15 (×9): 10 mg via ORAL
  Filled 2023-11-12 (×9): qty 1

## 2023-11-12 MED ORDER — ATORVASTATIN CALCIUM 20 MG PO TABS
80.0000 mg | ORAL_TABLET | Freq: Every day | ORAL | Status: DC
  Administered 2023-11-12 – 2023-11-15 (×4): 80 mg via ORAL
  Filled 2023-11-12 (×4): qty 4

## 2023-11-12 MED ORDER — METFORMIN HCL 500 MG PO TABS
500.0000 mg | ORAL_TABLET | Freq: Two times a day (BID) | ORAL | Status: DC
  Administered 2023-11-12 – 2023-11-15 (×6): 500 mg via ORAL
  Filled 2023-11-12 (×6): qty 1

## 2023-11-12 NOTE — Progress Notes (Signed)
 [facility] MD Progress Note  11/12/2023 6:21 PM Craig Fischer  MRN:  [id number]  Patient is a 38 year old African-American male with history of schizoaffective disorder presented to the emergency room after reported overdose on gabapentin  pills as an attempt of suicide in the context of being overwhelmed with multiple thoughts and alcohol intake. Patient is admitted to adult psych unit with Q15 min safety monitoring. Multidisciplinary team approach is offered. Medication management; group/milieu therapy is offered.   Subjective:  Chart reviewed, case discussed in multidisciplinary meeting, patient seen during rounds.  Patient is noted to be walking in the room.  He offers no complaints.  He reports feeling better and reports tolerating the medications well.  He is on Zoloft  and Abilify  which was started during this hospitalization.  He denies having any nausea or headache or any symptoms of EPS with the medication.  He reports staying in touch with his family members and his girlfriend.  He reports that he did talk to the social worker about the resources he needs when he gets home.  He denies SI/HI/plan and denies hallucinations as of today.  He understands that he is being monitored for the medications and any side effects and the discharge will be on Monday.  Sleep: Fair  Appetite:  Fair  Past Psychiatric History: see h&P Family History: History reviewed. No pertinent family history. Social History:  Social History   Substance and Sexual Activity  Alcohol Use Not Currently     Social History   Substance and Sexual Activity  Drug Use No    Social History   Socioeconomic History   Marital status: Single    Spouse name: Not on file   Number of children: Not on file   Years of education: Not on file   Highest education level: Not on file  Occupational History   Not on file  Tobacco Use   Smoking status: Former    Types: Cigarettes   Smokeless tobacco: Never  Substance and Sexual  Activity   Alcohol use: Not Currently   Drug use: No   Sexual activity: Yes  Other Topics Concern   Not on file  Social History Narrative   Not on file   Social Drivers of Health   Financial Resource Strain: Not on file  Food Insecurity: Food Insecurity Present (11/09/2023)   Hunger Vital Sign    Worried About Running Out of Food in the Last Year: Sometimes true    Ran Out of Food in the Last Year: Sometimes true  Transportation Needs: No Transportation Needs (11/09/2023)   PRAPARE - Administrator, Civil Service (Medical): No    Lack of Transportation (Non-Medical): No  Physical Activity: Not on file  Stress: Not on file  Social Connections: Not on file   Past Medical History:  Past Medical History:  Diagnosis Date   Asthma    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, unsp (HCC)    Diabetes mellitus without complication (HCC)    High cholesterol    Hypertension    Thyroid  disease     Past Surgical History:  Procedure Laterality Date   goiter     removal    Current Medications: Current Facility-Administered Medications  Medication Dose Route Frequency Provider Last Rate Last Admin   acetaminophen  (TYLENOL ) tablet 650 mg  650 mg Oral Q6H PRN Mardy Legacy, NP       alum & mag hydroxide-simeth (MAALOX/MYLANTA) 200-200-20 MG/5ML suspension 30 mL  30 mL Oral  Q4H PRN Mardy Legacy, NP       ARIPiprazole  (ABILIFY ) tablet 5 mg  5 mg Oral Daily Cassundra Mckeever, MD   5 mg at 11/12/23 9164   gabapentin  (NEURONTIN ) capsule 100 mg  100 mg Oral TID Maury Bamba, MD   100 mg at 11/12/23 1656   hydrOXYzine  (ATARAX ) tablet 25 mg  25 mg Oral TID PRN Mardy Legacy, NP       levothyroxine  (SYNTHROID ) tablet 175 mcg  175 mcg Oral Q0600 Eveleen Mcnear, MD   175 mcg at 11/12/23 0612   And   levothyroxine  (SYNTHROID ) tablet 100 mcg  100 mcg Oral Q0600 Nailyn Dearinger, MD   100 mcg at 11/12/23 0613   magnesium  hydroxide (MILK OF MAGNESIA) suspension 30 mL   30 mL Oral Daily PRN Mardy Legacy, NP       metFORMIN  (GLUCOPHAGE ) tablet 500 mg  500 mg Oral BID WC Harvir Patry, MD   500 mg at 11/12/23 1656   OLANZapine  (ZYPREXA ) injection 10 mg  10 mg Intramuscular TID PRN Mardy Legacy, NP       OLANZapine  (ZYPREXA ) injection 5 mg  5 mg Intramuscular TID PRN Mardy Legacy, NP       OLANZapine  zydis (ZYPREXA ) disintegrating tablet 5 mg  5 mg Oral TID PRN Mardy Legacy, NP       sertraline  (ZOLOFT ) tablet 50 mg  50 mg Oral Daily Anajulia Leyendecker, MD   50 mg at 11/12/23 9164   traZODone  (DESYREL ) tablet 50 mg  50 mg Oral QHS PRN Mardy Legacy, NP        Lab Results:  Results for orders placed or performed during the hospital encounter of 11/09/23 (from the past 48 hours)  Hemoglobin A1c     Status: Abnormal   Collection Time: 11/12/23  7:36 AM  Result Value Ref Range   Hgb A1c MFr Bld 6.5 (H) 4.8 - 5.6 %    Comment: (NOTE) Diagnosis of Diabetes The following HbA1c ranges recommended by the American Diabetes Association (ADA) may be used as an aid in the diagnosis of diabetes mellitus.  Hemoglobin             Suggested A1C NGSP%              Diagnosis  <5.7                   Non Diabetic  5.7-6.4                Pre-Diabetic  >6.4                   Diabetic  <7.0                   Glycemic control for                       adults with diabetes.     Mean Plasma Glucose 139.85 mg/dL    Comment: Performed at [facility], [address], [place], [place] [address]  Lipid panel     Status: Abnormal   Collection Time: 11/12/23  7:36 AM  Result Value Ref Range   Cholesterol 117 0 - 200 mg/dL   Triglycerides 51 <849 mg/dL   HDL 38 (L) >59 mg/dL   Total CHOL/HDL Ratio 3.1 RATIO   VLDL 10 0 - 40 mg/dL   LDL Cholesterol 69 0 - 99 mg/dL    Comment:  Total Cholesterol/HDL:CHD Risk Coronary Heart Disease Risk Table                     Men   Women  1/2 Average Risk   3.4   3.3  Average Risk       5.0   4.4  2 X  Average Risk   9.6   7.1  3 X Average Risk  23.4   11.0        Use the calculated Patient Ratio above and the CHD Risk Table to determine the patient's CHD Risk.        ATP III CLASSIFICATION (LDL):  <100     mg/dL   Optimal  899-870  mg/dL   Near or Above                    Optimal  130-159  mg/dL   Borderline  839-810  mg/dL   High  >809     mg/dL   Very High Performed at [facility], [address] [place], [place] [address]   Glucose, capillary     Status: Abnormal   Collection Time: 11/12/23  4:45 PM  Result Value Ref Range   Glucose-Capillary 194 (H) 70 - 99 mg/dL    Comment: Glucose reference range applies only to samples taken after fasting for at least 8 hours.    Blood Alcohol level:  Lab Results  Component Value Date   ETH 220 (H) 11/09/2023   ETH 255 (H) 02/01/2016    Metabolic Disorder Labs: Lab Results  Component Value Date   HGBA1C 6.5 (H) 11/12/2023   MPG 139.85 11/12/2023   MPG 395 (H) 03/14/2010   No results found for: PROLACTIN Lab Results  Component Value Date   CHOL 117 11/12/2023   TRIG 51 11/12/2023   HDL 38 (L) 11/12/2023   CHOLHDL 3.1 11/12/2023   VLDL 10 11/12/2023   LDLCALC 69 11/12/2023    Physical Findings: AIMS:  , ,  ,  ,    CIWA:    COWS:      Psychiatric Specialty Exam:  Presentation  General Appearance:  Appropriate for Environment; Casual  Eye Contact: Fair  Speech: Clear and Coherent  Speech Volume: Decreased    Mood and Affect  Mood: fine  Affect: euthymic   Thought Process  Thought Processes: concrete  Descriptions of Associations:Intact  Orientation:Full (Time, Place and Person)  Thought Content:Illogical  Hallucinations: Denies  Ideas of Reference:Paranoia  Suicidal Thoughts: Denies  Homicidal Thoughts: Denies   Sensorium  Memory: Immediate Fair; Recent Fair; Remote Poor  Judgment: Impaired  Insight: Shallow   Executive Functions   Concentration: Poor  Attention Span: Poor  Recall: Fair  Fund of Knowledge: Poor  Language: Fair   Psychomotor Activity  Psychomotor Activity: Normal  Musculoskeletal: Strength & Muscle Tone: within normal limits Gait & Station: normal Assets  Assets: Physical Health; Resilience; Social Support    Physical Exam: Physical Exam Vitals and nursing note reviewed.  HENT:     Head: Normocephalic.     Nose: Nose normal.  Neurological:     Mental Status: He is alert.    Review of Systems  Constitutional: Negative.   HENT: Negative.    Eyes: Negative.   Cardiovascular: Negative.   Skin: Negative.    Blood pressure (!) 134/92, pulse (!) 103, temperature (!) 97.5 F (36.4 C), resp. rate 20, height 5' 8 (1.727 m), weight 102.1 kg, SpO2 100%. Body  mass index is 34.21 kg/m.  Diagnosis: Principal Problem:   MDD (major depressive disorder)  Clinical Decision Making: Patient with history of schizoaffective disorder brought into the emergency room after a lethal overdose on gabapentin  as a suicide attempt in the context of worsening depression and hallucinations command type.  He needs ongoing hospitalization for further stabilization.   Treatment Plan Summary:   Safety and Monitoring:             -- Voluntary admission to inpatient psychiatric unit for safety, stabilization and treatment             -- Daily contact with patient to assess and evaluate symptoms and progress in treatment             -- Patient's case to be discussed in multi-disciplinary team meeting             -- Observation Level: q15 minute checks             -- Vital signs:  q12 hours             -- Precautions: suicide, elopement, and assault   2. Psychiatric Diagnoses and Treatment:                Zoloft  50 mg daily and Abilify  5 mg nightly to help with hallucinations and depression.  Patient gave consent to the medications   -- The risks/benefits/side-effects/alternatives to this  medication were discussed in detail with the patient and time was given for questions. The patient consents to medication trial.                -- Metabolic profile and EKG monitoring obtained while on an atypical antipsychotic (BMI: Lipid Panel: HbgA1c: QTc:)              -- Encouraged patient to participate in unit milieu and in scheduled group therapies                            3. Medical Issues Being Addressed:  Patient blood pressure is noted to be elevated and pulse-encouraged hydration added hydralazine  10 mg every 8 hours to manage the blood pressure.  Will recommend to follow-up with PCP for optimal management of blood pressure and blood sugars 4. Discharge Planning:   -- Social work and case management to assist with discharge planning and identification of hospital follow-up needs prior to discharge  -- Estimated LOS: 3-4 days  Allyn Foil, MD 11/12/2023, 6:21 PM

## 2023-11-12 NOTE — Plan of Care (Signed)

## 2023-11-12 NOTE — Progress Notes (Signed)
   11/12/23 0900  Psych Admission Type (Psych Patients Only)  Admission Status Voluntary  Psychosocial Assessment  Patient Complaints None  Eye Contact Fair  Facial Expression Flat  Affect Appropriate to circumstance  Speech Logical/coherent;Soft  Interaction Minimal  Motor Activity Slow  Appearance/Hygiene Unremarkable  Behavior Characteristics Cooperative  Mood Pleasant  Thought Process  Coherency WDL  Content WDL  Delusions None reported or observed  Perception WDL  Hallucination None reported or observed  Judgment Poor  Confusion None  Danger to Self  Current suicidal ideation? Denies  Danger to Others  Danger to Others None reported or observed

## 2023-11-12 NOTE — Group Note (Signed)
 Recreation Therapy Group Note   Group Topic:General Recreation  Group Date: 11/12/2023 Start Time: 1030 End Time: 1130 Facilitators: Celestia Jeoffrey BRAVO, LRT, CTRS Location: Courtyard  Group Description: [employer]. LRT and patients played games of basketball, drew with chalk, and played corn hole while outside in the courtyard while getting fresh air and sunlight. Music was being played in the background. LRT and peers conversed about different games they have played before, what they do in their free time and anything else that is on their minds. LRT encouraged pts to drink water after being outside, sweating and getting their heart rate up.  Goal Area(s) Addressed: Patient will build on frustration tolerance skills. Patients will partake in a competitive play game with peers. Patients will gain knowledge of new leisure interest/hobby.    Affect/Mood: N/A   Participation Level: Did not attend    Clinical Observations/Individualized Feedback: Patient did not attend group.   Plan: Continue to engage patient in RT group sessions 2-3x/week.   Jeoffrey BRAVO Celestia, LRT, CTRS 11/12/2023 11:53 AM

## 2023-11-12 NOTE — [facility] Suicide Risk Assessment (Signed)
 [facility] INPATIENT:  Family/Significant Other Suicide Prevention Education  Suicide Prevention Education:  Education Completed; Craig Fischer, [phone], Mother , has been identified by the patient as the family member/significant other with whom the patient will be residing, and identified as the person(s) who will aid the patient in the event of a mental health crisis (suicidal ideations/suicide attempt).  With written consent from the patient, the family member/significant other has been provided the following suicide prevention education, prior to the and/or following the discharge of the patient.  The suicide prevention education provided includes the following: Suicide risk factors Suicide prevention and interventions National Suicide Hotline telephone number [facility] assessment telephone number [facility] Emergency Assistance 911 [facility] and/or Residential Mobile Crisis Unit telephone number  Request made of family/significant other to: Remove weapons (e.g., guns, rifles, knives), all items previously/currently identified as safety concern.   Remove drugs/medications (over-the-counter, prescriptions, illicit drugs), all items previously/currently identified as a safety concern.  The family member/significant other verbalizes understanding of the suicide prevention education information provided.  The family member/significant other agrees to remove the items of safety concern listed above.  According to mother, patient's drinking is what triggered his current episode. Mother reports that "when he's not drinking, he's a different person." Mother reports that she does not feel that the patient is a danger to himself or others and confirmed that the patient does live alone in his home. Mother reports that she does have keys to the patient's home and at discharge his sister will provide transportation. Mother reports that the patient does not have access to any  weapons. Mother reported not safety concerns at this time.   Craig Fischer 11/12/2023, 12:48 PM

## 2023-11-12 NOTE — Group Note (Signed)
 Recreation Therapy Group Note   Group Topic:Leisure Education  Group Date: 11/12/2023 Start Time: 1300 End Time: 1355 Facilitators: Celestia Jeoffrey BRAVO, LRT, CTRS Location: Craft Room  Group Description: Leisure. Patients were given the option to choose from singing karaoke, coloring mandalas, using oil pastels, journaling, or playing with play-doh. LRT and pts discussed the meaning of leisure, the importance of participating in leisure during their free time/when they're outside of the hospital, as well as how our leisure interests can also serve as coping skills.   Goal Area(s) Addressed:  Patient will identify a current leisure interest.  Patient will learn the definition of "leisure". Patient will practice making a positive decision. Patient will have the opportunity to try a new leisure activity. Patient will communicate with peers and LRT.    Affect/Mood: N/A   Participation Level: Did not attend    Clinical Observations/Individualized Feedback: Patient did not attend group.   Plan: Continue to engage patient in RT group sessions 2-3x/week.   Jeoffrey BRAVO Celestia, LRT, CTRS 11/12/2023 2:56 PM

## 2023-11-12 NOTE — Group Note (Signed)
 Date:  11/12/2023 Time:  2:33 PM  Group Topic/Focus:  Healthy Communication:   The focus of this group is to discuss communication, barriers to communication, as well as healthy ways to communicate with others.    Participation Level:  Did Not Attend   Craig Fischer 11/12/2023, 2:33 PM

## 2023-11-13 DIAGNOSIS — F251 Schizoaffective disorder, depressive type: Secondary | ICD-10-CM | POA: Diagnosis not present

## 2023-11-13 NOTE — Group Note (Signed)
 Date:  11/13/2023 Time:  8:21 PM  Group Topic/Focus:  Activity Group: The focus of the group is to promote activity for the patients and encourage them to go outside to the courtyard and get some fresh air and exercise.    Participation Level:  Did Not Attend   Camellia HERO Andrw Mcguirt 11/13/2023, 8:21 PM

## 2023-11-13 NOTE — Progress Notes (Signed)
 [facility] MD Progress Note  11/13/2023 10:11 PM Craig Fischer  MRN:  [id number]  Patient is a 38 year old African-American male with history of schizoaffective disorder presented to the emergency room after reported overdose on gabapentin  pills as an attempt of suicide in the context of being overwhelmed with multiple thoughts and alcohol intake. Patient is admitted to adult psych unit with Q15 min safety monitoring. Multidisciplinary team approach is offered. Medication management; group/milieu therapy is offered.   Subjective:  Chart reviewed, case discussed in multidisciplinary meeting, patient seen during rounds.  Patient is noted to be resting in bed.  He offers no complaints.  He reports taking his medications and denies having any side effects including EPS.  He reports that the hallucinations have resolved and he denies having any suicidal/homicidal ideation/plan.  He reports that he is getting along very well with his family members and his girlfriend.  He talks about looking forward for his discharge on Monday.  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  Per nursing patient stays in his room for most of bedtime but has no behavioral problems.     Sleep: Fair  Appetite:  Fair  Past Psychiatric History: see h&P Family History: History reviewed. No pertinent family history. Social History:  Social History   Substance and Sexual Activity  Alcohol Use Not Currently     Social History   Substance and Sexual Activity  Drug Use No    Social History   Socioeconomic History   Marital status: Single    Spouse name: Not on file   Number of children: Not on file   Years of education: Not on file   Highest education level: Not on file  Occupational History   Not on file  Tobacco Use   Smoking status: Former    Types: Cigarettes   Smokeless tobacco: Never  Substance and Sexual Activity   Alcohol use: Not Currently   Drug use: No   Sexual  activity: Yes  Other Topics Concern   Not on file  Social History Narrative   Not on file   Social Drivers of Health   Financial Resource Strain: Not on file  Food Insecurity: Food Insecurity Present (11/09/2023)   Hunger Vital Sign    Worried About Running Out of Food in the Last Year: Sometimes true    Ran Out of Food in the Last Year: Sometimes true  Transportation Needs: No Transportation Needs (11/09/2023)   PRAPARE - Administrator, Civil Service (Medical): No    Lack of Transportation (Non-Medical): No  Physical Activity: Not on file  Stress: Not on file  Social Connections: Not on file   Past Medical History:  Past Medical History:  Diagnosis Date   Asthma    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, unsp (HCC)    Diabetes mellitus without complication (HCC)    High cholesterol    Hypertension    Thyroid  disease     Past Surgical History:  Procedure Laterality Date   goiter     removal    Current Medications: Current Facility-Administered Medications  Medication Dose Route Frequency Provider Last Rate Last Admin   acetaminophen  (TYLENOL ) tablet 650 mg  650 mg Oral Q6H PRN Mardy Legacy, NP       alum & mag hydroxide-simeth (MAALOX/MYLANTA) 200-200-20 MG/5ML suspension 30 mL  30 mL Oral Q4H PRN Mardy Legacy, NP       ARIPiprazole  (ABILIFY ) tablet 5  mg  5 mg Oral Daily Marifer Hurd, MD   5 mg at 11/13/23 0941   atorvastatin  (LIPITOR) tablet 80 mg  80 mg Oral Daily Davontae Prusinski, MD   80 mg at 11/13/23 9057   gabapentin  (NEURONTIN ) capsule 100 mg  100 mg Oral TID Heidi Lemay, MD   100 mg at 11/13/23 1849   hydrALAZINE  (APRESOLINE ) tablet 10 mg  10 mg Oral Q8H Atanacio Melnyk, MD   10 mg at 11/13/23 2150   hydrOXYzine  (ATARAX ) tablet 25 mg  25 mg Oral TID PRN Mardy Legacy, NP   25 mg at 11/12/23 2123   levothyroxine  (SYNTHROID ) tablet 175 mcg  175 mcg Oral Q0600 Disaya Walt, MD   175 mcg at 11/13/23 0709   And    levothyroxine  (SYNTHROID ) tablet 100 mcg  100 mcg Oral Q0600 Kairee Kozma, MD   100 mcg at 11/13/23 0709   magnesium  hydroxide (MILK OF MAGNESIA) suspension 30 mL  30 mL Oral Daily PRN Mardy Legacy, NP       metFORMIN  (GLUCOPHAGE ) tablet 500 mg  500 mg Oral BID WC Tenesia Escudero, MD   500 mg at 11/13/23 1849   OLANZapine  (ZYPREXA ) injection 10 mg  10 mg Intramuscular TID PRN Mardy Legacy, NP       OLANZapine  (ZYPREXA ) injection 5 mg  5 mg Intramuscular TID PRN Mardy Legacy, NP       OLANZapine  zydis (ZYPREXA ) disintegrating tablet 5 mg  5 mg Oral TID PRN Mardy Legacy, NP       sertraline  (ZOLOFT ) tablet 50 mg  50 mg Oral Daily Airon Sahni, MD   50 mg at 11/13/23 9057   traZODone  (DESYREL ) tablet 50 mg  50 mg Oral QHS PRN Mardy Legacy, NP   50 mg at 11/13/23 2120    Lab Results:  Results for orders placed or performed during the hospital encounter of 11/09/23 (from the past 48 hours)  Hemoglobin A1c     Status: Abnormal   Collection Time: 11/12/23  7:36 AM  Result Value Ref Range   Hgb A1c MFr Bld 6.5 (H) 4.8 - 5.6 %    Comment: (NOTE) Diagnosis of Diabetes The following HbA1c ranges recommended by the American Diabetes Association (ADA) may be used as an aid in the diagnosis of diabetes mellitus.  Hemoglobin             Suggested A1C NGSP%              Diagnosis  <5.7                   Non Diabetic  5.7-6.4                Pre-Diabetic  >6.4                   Diabetic  <7.0                   Glycemic control for                       adults with diabetes.     Mean Plasma Glucose 139.85 mg/dL    Comment: Performed at [facility], [address] [place], [place] [address]  Lipid panel     Status: Abnormal   Collection Time: 11/12/23  7:36 AM  Result Value Ref Range   Cholesterol 117 0 - 200 mg/dL   Triglycerides 51 <849 mg/dL   HDL 38 (  L) >40 mg/dL   Total CHOL/HDL Ratio 3.1 RATIO   VLDL 10 0 - 40 mg/dL   LDL Cholesterol 69 0 - 99 mg/dL     Comment:        Total Cholesterol/HDL:CHD Risk Coronary Heart Disease Risk Table                     Men   Women  1/2 Average Risk   3.4   3.3  Average Risk       5.0   4.4  2 X Average Risk   9.6   7.1  3 X Average Risk  23.4   11.0        Use the calculated Patient Ratio above and the CHD Risk Table to determine the patient's CHD Risk.        ATP III CLASSIFICATION (LDL):  <100     mg/dL   Optimal  899-870  mg/dL   Near or Above                    Optimal  130-159  mg/dL   Borderline  839-810  mg/dL   High  >809     mg/dL   Very High Performed at [facility], [address] [place], [place] [address]   Glucose, capillary     Status: Abnormal   Collection Time: 11/12/23  4:45 PM  Result Value Ref Range   Glucose-Capillary 194 (H) 70 - 99 mg/dL    Comment: Glucose reference range applies only to samples taken after fasting for at least 8 hours.    Blood Alcohol level:  Lab Results  Component Value Date   ETH 220 (H) 11/09/2023   ETH 255 (H) 02/01/2016    Metabolic Disorder Labs: Lab Results  Component Value Date   HGBA1C 6.5 (H) 11/12/2023   MPG 139.85 11/12/2023   MPG 395 (H) 03/14/2010   No results found for: PROLACTIN Lab Results  Component Value Date   CHOL 117 11/12/2023   TRIG 51 11/12/2023   HDL 38 (L) 11/12/2023   CHOLHDL 3.1 11/12/2023   VLDL 10 11/12/2023   LDLCALC 69 11/12/2023    Physical Findings: AIMS:  , ,  ,  ,    CIWA:    COWS:      Psychiatric Specialty Exam:  Presentation  General Appearance:  Appropriate for Environment; Casual  Eye Contact: Fair  Speech: Clear and Coherent  Speech Volume: Decreased    Mood and Affect  Mood: fine  Affect: euthymic   Thought Process  Thought Processes: concrete  Descriptions of Associations:Intact  Orientation:Full (Time, Place and Person)  Thought Content:Illogical  Hallucinations: Denies  Ideas of Reference:Paranoia  Suicidal Thoughts:  Denies  Homicidal Thoughts: Denies   Sensorium  Memory: Immediate Fair; Recent Fair; Remote Poor  Judgment: Impaired  Insight: Shallow   Executive Functions  Concentration: Poor  Attention Span: Poor  Recall: Fair  Fund of Knowledge: Poor  Language: Fair   Psychomotor Activity  Psychomotor Activity: Normal  Musculoskeletal: Strength & Muscle Tone: within normal limits Gait & Station: normal Assets  Assets: Physical Health; Resilience; Social Support    Physical Exam: Physical Exam Vitals and nursing note reviewed.  HENT:     Head: Normocephalic.     Nose: Nose normal.  Neurological:     Mental Status: He is alert.    Review of Systems  Constitutional: Negative.   HENT: Negative.    Eyes: Negative.  Cardiovascular: Negative.   Skin: Negative.    Blood pressure (!) 121/105, pulse (!) 108, temperature (!) 97.1 F (36.2 C), resp. rate 20, height 5' 8 (1.727 m), weight 102.1 kg, SpO2 98%. Body mass index is 34.21 kg/m.  Diagnosis: Principal Problem:   MDD (major depressive disorder)  Clinical Decision Making: Patient with history of schizoaffective disorder brought into the emergency room after a lethal overdose on gabapentin  as a suicide attempt in the context of worsening depression and hallucinations command type.  He needs ongoing hospitalization for further stabilization.   Treatment Plan Summary:   Safety and Monitoring:             -- Voluntary admission to inpatient psychiatric unit for safety, stabilization and treatment             -- Daily contact with patient to assess and evaluate symptoms and progress in treatment             -- Patient's case to be discussed in multi-disciplinary team meeting             -- Observation Level: q15 minute checks             -- Vital signs:  q12 hours             -- Precautions: suicide, elopement, and assault   2. Psychiatric Diagnoses and Treatment:                Zoloft  50 mg daily and  Abilify  5 mg nightly to help with hallucinations and depression.  Patient gave consent to the medications   -- The risks/benefits/side-effects/alternatives to this medication were discussed in detail with the patient and time was given for questions. The patient consents to medication trial.                -- Metabolic profile and EKG monitoring obtained while on an atypical antipsychotic (BMI: Lipid Panel: HbgA1c: QTc:)              -- Encouraged patient to participate in unit milieu and in scheduled group therapies                            3. Medical Issues Being Addressed:  Patient blood pressure is noted to be elevated and pulse-encouraged hydration added hydralazine  10 mg every 8 hours to manage the blood pressure.  Will recommend to follow-up with PCP for optimal management of blood pressure and blood sugars 4. Discharge Planning:   -- Social work and case management to assist with discharge planning and identification of hospital follow-up needs prior to discharge  -- Estimated LOS: 3-4 days  Allyn Foil, MD 11/13/2023, 10:11 PM

## 2023-11-13 NOTE — Plan of Care (Signed)
   Problem: Education: Goal: Knowledge of [place] [facility] Education information/materials will improve Outcome: Progressing Goal: Emotional status will improve Outcome: Progressing Goal: Mental status will improve Outcome: Progressing

## 2023-11-13 NOTE — Progress Notes (Signed)
   11/12/23 2100  Psych Admission Type (Psych Patients Only)  Admission Status Voluntary  Psychosocial Assessment  Patient Complaints None  Eye Contact Fair  Facial Expression Flat  Affect Appropriate to circumstance  Speech Logical/coherent;Soft  Interaction Minimal  Motor Activity Slow  Appearance/Hygiene Unremarkable  Behavior Characteristics Cooperative  Mood Pleasant  Aggressive Behavior  Effect No apparent injury  Thought Process  Coherency WDL  Content WDL  Delusions None reported or observed  Perception WDL  Hallucination None reported or observed  Judgment Poor  Confusion None  Danger to Self  Current suicidal ideation? Denies  Danger to Others  Danger to Others None reported or observed

## 2023-11-13 NOTE — Progress Notes (Signed)
 Pt calm, cooperative and adherent to unit schedule and medication regimen.  Pt isolative in his room at times.  Safety was maintained.    11/13/23 1200  Psych Admission Type (Psych Patients Only)  Admission Status Voluntary  Psychosocial Assessment  Patient Complaints None  Eye Contact Fair  Facial Expression Flat  Affect Appropriate to circumstance  Speech Logical/coherent  Interaction Minimal  Motor Activity Slow  Appearance/Hygiene Unremarkable  Behavior Characteristics Cooperative  Mood Pleasant  Thought Process  Coherency WDL  Content WDL  Delusions None reported or observed  Perception WDL  Hallucination None reported or observed  Judgment Poor  Confusion None  Danger to Self  Current suicidal ideation? Denies  Danger to Others  Danger to Others None reported or observed

## 2023-11-13 NOTE — Group Note (Signed)
 Date:  11/13/2023 Time:  2:30 PM  Group Topic/Focus:  Dimensions of Wellness:   The focus of this group is to introduce the topic of wellness and discuss the role each dimension of wellness plays in total health.    Participation Level:  Did Not Attend   Craig Fischer 11/13/2023, 2:30 PM

## 2023-11-13 NOTE — Plan of Care (Signed)
   Problem: Education: Goal: Emotional status will improve Outcome: Progressing   Problem: Education: Goal: Mental status will improve Outcome: Progressing

## 2023-11-14 DIAGNOSIS — F251 Schizoaffective disorder, depressive type: Secondary | ICD-10-CM | POA: Diagnosis not present

## 2023-11-14 NOTE — Progress Notes (Signed)
   11/14/23 1000  Psych Admission Type (Psych Patients Only)  Admission Status Voluntary  Psychosocial Assessment  Patient Complaints None  Eye Contact Fair  Facial Expression Flat  Affect Appropriate to circumstance  Speech Logical/coherent  Interaction Minimal  Motor Activity Slow  Appearance/Hygiene Unremarkable  Behavior Characteristics Cooperative  Mood Pleasant  Thought Process  Coherency WDL  Content WDL  Delusions None reported or observed  Perception WDL  Hallucination None reported or observed  Judgment Poor  Confusion WDL  Danger to Self  Current suicidal ideation? Denies  Agreement Not to Harm Self Yes  Description of Agreement Verbal  Danger to Others  Danger to Others None reported or observed    Craig Fischer spent most of this shift in his room. Pleasant on approach, but prefers to spend his time alone. Denies SI/SH/HI. States that he looks forward to his upcoming discharge.

## 2023-11-14 NOTE — Plan of Care (Signed)
  Problem: Education: Goal: Emotional status will improve Outcome: Progressing   Problem: Education: Goal: Mental status will improve Outcome: Progressing   Problem: Activity: Goal: Sleeping patterns will improve Outcome: Progressing   Problem: Coping: Goal: Ability to verbalize frustrations and anger appropriately will improve Outcome: Progressing   Problem: Self-Concept: Goal: Ability to modify response to factors that promote anxiety will improve Outcome: Progressing

## 2023-11-14 NOTE — Progress Notes (Signed)
 Pt withdraw - isolated to his room throughout the shift.  Reports being tired and thinks it is because of the medication.  Pt denied AVH, anxiety, depression and SI/HI adding, "I want to be alive for my family; I am trying to get help.  He took HS scheduled medications.    11/13/23 2300  Psych Admission Type (Psych Patients Only)  Admission Status Voluntary  Psychosocial Assessment  Patient Complaints None  Eye Contact Fair  Facial Expression Flat  Affect Appropriate to circumstance  Speech Logical/coherent  Interaction Minimal;Seclusive  Motor Activity Slow  Appearance/Hygiene Unremarkable  Behavior Characteristics Cooperative  Mood Pleasant  Thought Process  Coherency WDL  Content WDL  Delusions None reported or observed  Perception WDL  Hallucination None reported or observed  Judgment Limited  Confusion WDL  Danger to Self  Current suicidal ideation? Denies  Agreement Not to Harm Self Yes  Description of Agreement Verbal  Danger to Others  Danger to Others None reported or observed    Problem: Education: Goal: Knowledge of [facility] Education information/materials will improve Outcome: Progressing Goal: Emotional status will improve Outcome: Progressing Goal: Mental status will improve Outcome: Progressing Goal: Verbalization of understanding the information provided will improve Outcome: Progressing   Problem: Activity: Goal: Interest or engagement in activities will improve Outcome: Progressing Goal: Sleeping patterns will improve Outcome: Progressing   Problem: Coping: Goal: Ability to verbalize frustrations and anger appropriately will improve Outcome: Progressing Goal: Ability to demonstrate self-control will improve Outcome: Progressing

## 2023-11-14 NOTE — Group Note (Signed)
 LCSW Group Therapy Note   Group Date: 11/14/2023 Start Time: 1300 End Time: 1400   Type of Therapy and Topic:  Group Therapy: Boundaries  Participation Level:  Did Not Attend  Description of Group: This group will address the use of boundaries in their personal lives. Patients will explore why boundaries are important, the difference between healthy and unhealthy boundaries, and negative and postive outcomes of different boundaries and will look at how boundaries can be crossed.  Patients will be encouraged to identify current boundaries in their own lives and identify what kind of boundary is being set. Facilitators will guide patients in utilizing problem-solving interventions to address and correct types boundaries being used and to address when no boundary is being used. Understanding and applying boundaries will be explored and addressed for obtaining and maintaining a balanced life. Patients will be encouraged to explore ways to assertively make their boundaries and needs known to significant others in their lives, using other group members and facilitator for role play, support, and feedback.  Therapeutic Goals:  1.  Patient will identify areas in their life where setting clear boundaries could be  used to improve their life.  2.  Patient will identify signs/triggers that a boundary is not being respected. 3.  Patient will identify two ways to set boundaries in order to achieve balance in  their lives: 4.  Patient will demonstrate ability to communicate their needs and set boundaries  through discussion and/or role plays  Summary of Patient Progress:   The patient did not attend.   Therapeutic Modalities:   Cognitive Behavioral Therapy Solution-Focused Therapy  Roselyn GORMAN Lento, LCSWA 11/14/2023  4:21 PM

## 2023-11-14 NOTE — Progress Notes (Signed)
 [facility] MD Progress Note  11/14/2023 1:12 PM Craig Fischer  MRN:  [id number]  Patient is a 38 year old African-American male with history of schizoaffective disorder presented to the emergency room after reported overdose on gabapentin  pills as an attempt of suicide in the context of being overwhelmed with multiple thoughts and alcohol intake. Patient is admitted to adult psych unit with Q15 min safety monitoring. Multidisciplinary team approach is offered. Medication management; group/milieu therapy is offered.   Subjective:  Chart reviewed, case discussed in multidisciplinary meeting, patient seen during rounds.  Patient is noted to be waiting at the phone to call his girlfriend.  He offers no further complaints.  He reports taking his medication and feeling better.  He denies any hallucinations and denies any symptoms of depression as of today.  He is denies SI/HI/intent/plan.  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  He reports being in contact with his family and girlfriend and he will be meeting with them once he is discharged tomorrow.  Sleep: Fair  Appetite:  Fair  Past Psychiatric History: see h&P Family History: History reviewed. No pertinent family history. Social History:  Social History   Substance and Sexual Activity  Alcohol Use Not Currently     Social History   Substance and Sexual Activity  Drug Use No    Social History   Socioeconomic History   Marital status: Single    Spouse name: Not on file   Number of children: Not on file   Years of education: Not on file   Highest education level: Not on file  Occupational History   Not on file  Tobacco Use   Smoking status: Former    Types: Cigarettes   Smokeless tobacco: Never  Substance and Sexual Activity   Alcohol use: Not Currently   Drug use: No   Sexual activity: Yes  Other Topics Concern   Not on file  Social History Narrative   Not on file   Social  Drivers of Health   Financial Resource Strain: Not on file  Food Insecurity: Food Insecurity Present (11/09/2023)   Hunger Vital Sign    Worried About Running Out of Food in the Last Year: Sometimes true    Ran Out of Food in the Last Year: Sometimes true  Transportation Needs: No Transportation Needs (11/09/2023)   PRAPARE - Administrator, Civil Service (Medical): No    Lack of Transportation (Non-Medical): No  Physical Activity: Not on file  Stress: Not on file  Social Connections: Not on file   Past Medical History:  Past Medical History:  Diagnosis Date   Asthma    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, unsp (HCC)    Diabetes mellitus without complication (HCC)    High cholesterol    Hypertension    Thyroid  disease     Past Surgical History:  Procedure Laterality Date   goiter     removal    Current Medications: Current Facility-Administered Medications  Medication Dose Route Frequency Provider Last Rate Last Admin   acetaminophen  (TYLENOL ) tablet 650 mg  650 mg Oral Q6H PRN Mardy Legacy, NP       alum & mag hydroxide-simeth (MAALOX/MYLANTA) 200-200-20 MG/5ML suspension 30 mL  30 mL Oral Q4H PRN Mardy Legacy, NP       ARIPiprazole  (ABILIFY ) tablet 5 mg  5 mg Oral Daily Ireoluwa Gorsline, MD   5 mg at 11/14/23  9087   atorvastatin  (LIPITOR) tablet 80 mg  80 mg Oral Daily Latiesha Harada, MD   80 mg at 11/14/23 0913   gabapentin  (NEURONTIN ) capsule 100 mg  100 mg Oral TID Annalicia Renfrew, MD   100 mg at 11/14/23 1259   hydrALAZINE  (APRESOLINE ) tablet 10 mg  10 mg Oral Q8H Arlenis Blaydes, MD   10 mg at 11/14/23 1300   hydrOXYzine  (ATARAX ) tablet 25 mg  25 mg Oral TID PRN Mardy Legacy, NP   25 mg at 11/12/23 2123   levothyroxine  (SYNTHROID ) tablet 175 mcg  175 mcg Oral Q0600 Craig Wanek, MD   175 mcg at 11/14/23 0617   And   levothyroxine  (SYNTHROID ) tablet 100 mcg  100 mcg Oral Q0600 Craig Sandeen, MD   100 mcg at 11/14/23 0617    magnesium  hydroxide (MILK OF MAGNESIA) suspension 30 mL  30 mL Oral Daily PRN Mardy Legacy, NP       metFORMIN  (GLUCOPHAGE ) tablet 500 mg  500 mg Oral BID WC Aiyden Lauderback, MD   500 mg at 11/14/23 9086   OLANZapine  (ZYPREXA ) injection 10 mg  10 mg Intramuscular TID PRN Mardy Legacy, NP       OLANZapine  (ZYPREXA ) injection 5 mg  5 mg Intramuscular TID PRN Mardy Legacy, NP       OLANZapine  zydis (ZYPREXA ) disintegrating tablet 5 mg  5 mg Oral TID PRN Mardy Legacy, NP       sertraline  (ZOLOFT ) tablet 50 mg  50 mg Oral Daily Tesslyn Baumert, MD   50 mg at 11/14/23 0913   traZODone  (DESYREL ) tablet 50 mg  50 mg Oral QHS PRN Mardy Legacy, NP   50 mg at 11/13/23 2120    Lab Results:  Results for orders placed or performed during the hospital encounter of 11/09/23 (from the past 48 hours)  Glucose, capillary     Status: Abnormal   Collection Time: 11/12/23  4:45 PM  Result Value Ref Range   Glucose-Capillary 194 (H) 70 - 99 mg/dL    Comment: Glucose reference range applies only to samples taken after fasting for at least 8 hours.    Blood Alcohol level:  Lab Results  Component Value Date   ETH 220 (H) 11/09/2023   ETH 255 (H) 02/01/2016    Metabolic Disorder Labs: Lab Results  Component Value Date   HGBA1C 6.5 (H) 11/12/2023   MPG 139.85 11/12/2023   MPG 395 (H) 03/14/2010   No results found for: PROLACTIN Lab Results  Component Value Date   CHOL 117 11/12/2023   TRIG 51 11/12/2023   HDL 38 (L) 11/12/2023   CHOLHDL 3.1 11/12/2023   VLDL 10 11/12/2023   LDLCALC 69 11/12/2023    Physical Findings: AIMS:  , ,  ,  ,    CIWA:    COWS:      Psychiatric Specialty Exam:  Presentation  General Appearance:  Appropriate for Environment; Casual  Eye Contact: Fair  Speech: Clear and Coherent  Speech Volume: Normal    Mood and Affect  Mood: fine  Affect: euthymic   Thought Process  Thought Processes: concrete  Descriptions of  Associations:Intact  Orientation:Full (Time, Place and Person)  Thought Content:Logical  Hallucinations: Denies  Ideas of Reference:None  Suicidal Thoughts: Denies  Homicidal Thoughts: Denies   Sensorium  Memory: Immediate Fair; Recent Fair; Remote Fair  Judgment: Fair  Insight: Fair   Chartered certified accountant: Fair  Attention Span: Fair  Recall: [employer] of Knowledge: Fair  Language: Fair   Psychomotor Activity  Psychomotor Activity: Normal  Musculoskeletal: Strength & Muscle Tone: within normal limits Gait & Station: normal Assets  Assets: Manufacturing systems engineer; Desire for Improvement; Resilience; Social Support    Physical Exam: Physical Exam Vitals and nursing note reviewed.  HENT:     Head: Normocephalic.     Nose: Nose normal.  Neurological:     Mental Status: He is alert.    Review of Systems  Constitutional: Negative.   HENT: Negative.    Eyes: Negative.   Cardiovascular: Negative.   Skin: Negative.    Blood pressure 109/84, pulse (!) 108, temperature (!) 97.3 F (36.3 C), resp. rate 18, height 5' 8 (1.727 m), weight 102.1 kg, SpO2 100%. Body mass index is 34.21 kg/m.  Diagnosis: Principal Problem:   MDD (major depressive disorder) Moderate Alcohol use disorder, moderate Clinical Decision Making: Patient with history of schizoaffective disorder brought into the emergency room after a lethal overdose on gabapentin  as a suicide attempt in the context of worsening depression and hallucinations command type.  He is currently psychiatrically hospitalized for further stabilization.  Patient has responded fairly well to the current medication.   Treatment Plan Summary:   Safety and Monitoring:             -- Voluntary admission to inpatient psychiatric unit for safety, stabilization and treatment             -- Daily contact with patient to assess and evaluate symptoms and progress in treatment             --  Patient's case to be discussed in multi-disciplinary team meeting             -- Observation Level: q15 minute checks             -- Vital signs:  q12 hours             -- Precautions: suicide, elopement, and assault   2. Psychiatric Diagnoses and Treatment:                Zoloft  50 mg daily and Abilify  5 mg nightly to help with hallucinations and depression.  Patient gave consent to the medications   -- The risks/benefits/side-effects/alternatives to this medication were discussed in detail with the patient and time was given for questions. The patient consents to medication trial.                -- Metabolic profile and EKG monitoring obtained while on an atypical antipsychotic (BMI: Lipid Panel: HbgA1c: QTc:)              -- Encouraged patient to participate in unit milieu and in scheduled group therapies                            3. Medical Issues Being Addressed:  Patient blood pressure is noted to be elevated and pulse-encouraged hydration added hydralazine  10 mg every 8 hours to manage the blood pressure.  Will recommend to follow-up with PCP for optimal management of blood pressure and blood sugars 4. Discharge Planning:   -- Social work and case management to assist with discharge planning and identification of hospital follow-up needs prior to discharge  -- Estimated LOS: 3-4 days  Jessice Madill, MD 11/14/2023, 1:12 PM

## 2023-11-14 NOTE — Group Note (Signed)
 Date:  11/14/2023 Time:  10:20 AM  Group Topic/Focus:  Rediscovering Joy:   The focus of this group is to explore various ways to relieve stress in a positive manner.    Participation Level:  Did Not Attend   Craig Fischer Kizzie Cotten 11/14/2023, 10:20 AM

## 2023-11-15 DIAGNOSIS — F251 Schizoaffective disorder, depressive type: Secondary | ICD-10-CM | POA: Diagnosis not present

## 2023-11-15 MED ORDER — METFORMIN HCL 500 MG PO TABS: 500.0000 mg | ORAL_TABLET | Freq: Two times a day (BID) | ORAL | 0 refills | Status: AC

## 2023-11-15 MED ORDER — ARIPIPRAZOLE 5 MG PO TABS: 5.0000 mg | ORAL_TABLET | Freq: Every day | ORAL | 0 refills | Status: AC

## 2023-11-15 MED ORDER — LEVOTHYROXINE SODIUM 50 MCG PO TABS: ORAL_TABLET | ORAL | 0 refills | Status: AC

## 2023-11-15 MED ORDER — SERTRALINE HCL 50 MG PO TABS: 50.0000 mg | ORAL_TABLET | Freq: Every day | ORAL | 0 refills | Status: AC

## 2023-11-15 NOTE — Progress Notes (Signed)
 Craig Fischer stays in be throughout the shift; came out when prompted for HS medication.  He denied SI/HI, AVH, depression and anxiety.    11/14/23 2200  Psych Admission Type (Psych Patients Only)  Admission Status Voluntary  Psychosocial Assessment  Patient Complaints None  Eye Contact Fair  Facial Expression Flat  Affect Appropriate to circumstance  Speech Logical/coherent  Interaction Minimal;Isolative  Motor Activity Slow  Appearance/Hygiene Unremarkable  Behavior Characteristics Cooperative  Mood Pleasant  Thought Process  Coherency WDL  Content WDL  Delusions None reported or observed  Perception WDL  Hallucination None reported or observed  Judgment Poor  Confusion WDL  Danger to Self  Current suicidal ideation? Denies  Agreement Not to Harm Self Yes  Description of Agreement Verbal  Danger to Others  Danger to Others None reported or observed    Problem: Education: Goal: Knowledge of [facility] Education information/materials will improve Outcome: Progressing Goal: Emotional status will improve Outcome: Progressing Goal: Mental status will improve Outcome: Progressing Goal: Verbalization of understanding the information provided will improve Outcome: Progressing   Problem: Activity: Goal: Interest or engagement in activities will improve Outcome: Progressing Goal: Sleeping patterns will improve Outcome: Progressing   Problem: Coping: Goal: Ability to verbalize frustrations and anger appropriately will improve Outcome: Progressing Goal: Ability to demonstrate self-control will improve Outcome: Progressing

## 2023-11-15 NOTE — Plan of Care (Signed)
  Problem: Education: Goal: Knowledge of [facility] Education information/materials will improve Outcome: Progressing Goal: Emotional status will improve Outcome: Progressing Goal: Mental status will improve Outcome: Progressing Goal: Verbalization of understanding the information provided will improve Outcome: Progressing   Problem: Activity: Goal: Interest or engagement in activities will improve Outcome: Progressing Goal: Sleeping patterns will improve Outcome: Progressing   Problem: Coping: Goal: Ability to verbalize frustrations and anger appropriately will improve Outcome: Progressing Goal: Ability to demonstrate self-control will improve Outcome: Progressing   Problem: Coping: Goal: Coping ability will improve Outcome: Progressing   Problem: Coping: Goal: Coping ability will improve Outcome: Progressing Goal: Will verbalize feelings Outcome: Progressing

## 2023-11-15 NOTE — Group Note (Signed)
 Recreation Therapy Group Note   Group Topic:Health and Wellness  Group Date: 11/15/2023 Start Time: 1050 End Time: 1130 Facilitators: Celestia Jeoffrey BRAVO, LRT, CTRS Location: Courtyard  Group Description: [employer]. LRT and patients played games of basketball, drew with chalk, and played corn hole while outside in the courtyard while getting fresh air and sunlight. Music was being played in the background. LRT and peers conversed about different games they have played before, what they do in their free time and anything else that is on their minds. LRT encouraged pts to drink water after being outside, sweating and getting their heart rate up.  Goal Area(s) Addressed: Patient will build on frustration tolerance skills. Patients will partake in a competitive play game with peers. Patients will gain knowledge of new leisure interest/hobby.    Affect/Mood: N/A   Participation Level: Did not attend    Clinical Observations/Individualized Feedback: Patient did not attend group.   Plan: Continue to engage patient in RT group sessions 2-3x/week.   Jeoffrey BRAVO Celestia, LRT, CTRS 11/15/2023 1:00 PM

## 2023-11-15 NOTE — [facility] Suicide Risk Assessment (Signed)
 [facility] Discharge Suicide Risk Assessment   Principal Problem: MDD (major depressive disorder), recurrent episode, moderate (HCC) Discharge Diagnoses: Principal Problem:   MDD (major depressive disorder), recurrent episode, moderate (HCC)   Total Time spent with patient: 30 minutes  Musculoskeletal: Strength & Muscle Tone: within normal limits Gait & Station: normal Patient leans: N/A  Psychiatric Specialty Exam  Presentation  General Appearance:  Appropriate for Environment; Casual  Eye Contact: Fair  Speech: Clear and Coherent  Speech Volume: Normal  Handedness: Right   Mood and Affect  Mood: Euthymic  Duration of Depression Symptoms: Greater than two weeks  Affect: Appropriate   Thought Process  Thought Processes: Coherent  Descriptions of Associations:Intact  Orientation:Full (Time, Place and Person)  Thought Content:Logical  History of Schizophrenia/Schizoaffective disorder:No  Duration of Psychotic Symptoms:No data recorded Hallucinations:Hallucinations: None  Ideas of Reference:None  Suicidal Thoughts:Suicidal Thoughts: No  Homicidal Thoughts:Homicidal Thoughts: No   Sensorium  Memory: Immediate Fair; Recent Fair; Remote Fair  Judgment: Fair  Insight: Fair   Art therapist  Concentration: Fair  Attention Span: Fair  Recall: [employer] of Knowledge: Fair  Language: Fair   Psychomotor Activity  Psychomotor Activity: Psychomotor Activity: Normal   Assets  Assets: Communication Skills; Resilience; Social Support   Sleep  Sleep: Sleep: Fair  Estimated Sleeping Duration (Last 24 Hours): 12.00-12.25 hours  Physical Exam: Physical Exam ROS Blood pressure 127/85, pulse (!) 118, temperature (!) 97.2 F (36.2 C), resp. rate 18, height 5' 8 (1.727 m), weight 102.1 kg, SpO2 98%. Body mass index is 34.21 kg/m.  Mental Status Per Nursing Assessment::   On Admission:  NA  Demographic Factors:   Male  Loss Factors: Decrease in vocational status  Historical Factors: Impulsivity  Risk Reduction Factors:   Living with another person, especially a relative, Positive social support, Positive therapeutic relationship, and Positive coping skills or problem solving skills  Continued Clinical Symptoms:  Depression:   Comorbid alcohol abuse/dependence  Cognitive Features That Contribute To Risk:  None    Suicide Risk:  Minimal: No identifiable suicidal ideation.  Patients presenting with no risk factors but with morbid ruminations; may be classified as minimal risk based on the severity of the depressive symptoms   Follow-up Information     [facility]. Go to.   Why: In person assessment for therapy and medication management is 11/18/23 at 11:00 AM. Contact information: [address] [place] [address] [phone]         [facility] Follow up.   Why: In person SAIOP (Substance Abuse Intensive Outpatient) assessment 11/23/23 at 10:00 AM w/ [facility]. Contact information: Address: [address] [place], [place] [address]  Phone: [phone]  Fax: [phone]                Plan Of Care/Follow-up recommendations:  Activity:  As tolerated  Allyn Foil, MD 11/15/2023, 12:09 PM

## 2023-11-15 NOTE — Care Management Important Message (Signed)
 Important Message  Patient Details  Name: Craig Fischer MRN: [id number] Date of Birth: 09-17-1985   Medicare Important Message Given:  Yes - Medicare IM     [name], LCSW 11/15/2023, 1:17 PM

## 2023-11-15 NOTE — Progress Notes (Signed)
  [facility] Adult Case Management Discharge Plan :  Will you be returning to the same living situation after discharge:  Yes,  pt plans to return home. At discharge, do you have transportation home?: Yes,  pt support system to provide transportation.  Do you have the ability to pay for your medications: Yes,  [employer] MEDICARE / DREMA DUAL COMPLETE  Release of information consent forms completed and in the chart;  Patient's signature needed at discharge.  Patient to Follow up at:  Follow-up Information     [facility]. Go to.   Why: In person assessment for therapy and medication management is 11/18/23 at 11:00 AM. Contact information: [address] [place] [address] [phone]         [facility] Follow up.   Why: In person SAIOP (Substance Abuse Intensive Outpatient) assessment 11/23/23 at 10:00 AM w/ [facility]. Contact information: Address: [address], [place], [place] [address]  Phone: [phone]  Fax: [phone]                Next level of care provider has access to [facility] Link:no  Safety Planning and Suicide Prevention discussed: Yes,  SPE completed with mother, Berwyn Gearing.       Has patient been referred to the Quitline?: Patient refused referral for treatment  Patient has been referred for addiction treatment: No known substance use disorder.  [name], LCSW 11/15/2023, 1:18 PM

## 2023-11-15 NOTE — Group Note (Signed)
 [facility] LCSW Group Therapy Note    Group Date: 11/15/2023 Start Time: 1300 End Time: 1345  Type of Therapy and Topic:  Group Therapy:  Overcoming Obstacles  Participation Level:  [facility] PARTICIPATION LEVEL: Did Not Attend   Description of Group:   In this group patients will be encouraged to explore what they see as obstacles to their own wellness and recovery. They will be guided to discuss their thoughts, feelings, and behaviors related to these obstacles. The group will process together ways to cope with barriers, with attention given to specific choices patients can make. Each patient will be challenged to identify changes they are motivated to make in order to overcome their obstacles. This group will be process-oriented, with patients participating in exploration of their own experiences as well as giving and receiving support and challenge from other group members.  Therapeutic Goals: 1. Patient will identify personal and current obstacles as they relate to admission. 2. Patient will identify barriers that currently interfere with their wellness or overcoming obstacles.  3. Patient will identify feelings, thought process and behaviors related to these barriers. 4. Patient will identify two changes they are willing to make to overcome these obstacles:    Summary of Patient Progress Patient did not attend group.    Therapeutic Modalities:   Cognitive Behavioral Therapy Solution Focused Therapy Motivational Interviewing Relapse Prevention Therapy   [name], LCSW

## 2023-11-15 NOTE — Progress Notes (Signed)
   11/15/23 0900  Level of Consciousness  Level of Consciousness Alert  Pain Assessment  Pain Scale 0-10  Pain Score 0  PCA/Epidural/Spinal Assessment  Respiratory Pattern Regular

## 2023-11-15 NOTE — Discharge Summary (Signed)
 Physician Discharge Summary Note  Patient:  Craig Fischer is an 38 y.o., male MRN:  [id number] DOB:  [birth year] Patient phone:  [phone] (home)  Patient address:   [address] [place] [address],    Date of Admission:  11/09/2023 Date of Discharge: 11/15/23  Reason for Admission:  Patient is a 38 year old African-American male with history of schizoaffective disorder presented to the emergency room after reported overdose on gabapentin  pills as an attempt of suicide in the context of being overwhelmed with multiple thoughts and alcohol intake Patient is admitted to adult psych unit with Q15 min safety monitoring. Multidisciplinary team approach is offered. Medication management; group/milieu therapy is offered.   Principal Problem: MDD (major depressive disorder), recurrent episode, moderate (HCC) Discharge Diagnoses: Principal Problem:   MDD (major depressive disorder), recurrent episode, moderate (HCC)   Past Psychiatric History: see h&p  Family Psychiatric  History: see h&p Social History:  Social History   Substance and Sexual Activity  Alcohol Use Not Currently     Social History   Substance and Sexual Activity  Drug Use No    Social History   Socioeconomic History   Marital status: Single    Spouse name: Not on file   Number of children: Not on file   Years of education: Not on file   Highest education level: Not on file  Occupational History   Not on file  Tobacco Use   Smoking status: Former    Types: Cigarettes   Smokeless tobacco: Never  Substance and Sexual Activity   Alcohol use: Not Currently   Drug use: No   Sexual activity: Yes  Other Topics Concern   Not on file  Social History Narrative   Not on file   Social Drivers of Health   Financial Resource Strain: Not on file  Food Insecurity: Food Insecurity Present (11/09/2023)   Hunger Vital Sign    Worried About Running Out of Food in the Last Year: Sometimes true    Ran  Out of Food in the Last Year: Sometimes true  Transportation Needs: No Transportation Needs (11/09/2023)   PRAPARE - Administrator, Civil Service (Medical): No    Lack of Transportation (Non-Medical): No  Physical Activity: Not on file  Stress: Not on file  Social Connections: Not on file   Past Medical History:  Past Medical History:  Diagnosis Date   Asthma    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 No pertinent family history.  Hospital Course:  Patient is a 38 year old African-American male with history of schizoaffective disorder presented to the emergency room after reported overdose on gabapentin  pills as an attempt of suicide in the context of being overwhelmed with multiple thoughts and alcohol intake Patient is admitted to adult psych unit with Q15 min safety monitoring. Multidisciplinary team approach is offered. Medication management; group/milieu therapy is offered.   Detailed risk assessment is complete based on clinical exam and individual risk factors and acute suicide risk is low and acute violence risk is low.    On admission, patient was started on Prozac 20 mg daily and Abilify  5 mg to help with the depression and hallucinations.  Patient  showed no symptoms of alcohol withdrawal and remained stable on the medications.  His mood and depression improved significantly and hallucinations resolved within a day.  On the day of discharge he consistently denied SI/HI/plan and denied hallucinations.     Currently, all modifiable risk of harm to self/harm to others have been addressed and patient is no longer appropriate for the acute inpatient setting and is able to continue treatment for mental health needs in the community  with the supports as indicated below.  Patient is educated and verbalized understanding of discharge plan of care including medications, follow-up appointments, mental health resources and further crisis services in the community.  He is instructed to call 911 or present to the nearest emergency room should he experience any decompensation in mood, disturbance of bowel or return of suicidal/homicidal ideations.  Patient verbalizes understanding of this education and agrees to this plan of care  Physical Findings: AIMS:  , ,  ,  ,    CIWA:    COWS:        Psychiatric Specialty Exam:  Presentation  General Appearance:  Appropriate for Environment; Casual  Eye Contact: Fair  Speech: Clear and Coherent  Speech Volume: Normal    Mood and Affect  Mood: Euthymic  Affect: Appropriate   Thought Process  Thought Processes: Coherent  Descriptions of Associations:Intact  Orientation:Full (Time, Place and Person)  Thought Content:Logical  Hallucinations:Hallucinations: None  Ideas of Reference:None  Suicidal Thoughts:Suicidal Thoughts: No  Homicidal Thoughts:Homicidal Thoughts: No   Sensorium  Memory: Immediate Fair; Recent Fair; Remote Fair  Judgment: Fair  Insight: Fair   Art therapist  Concentration: Fair  Attention Span: Fair  Recall: [employer] of Knowledge: Fair  Language: Fair   Psychomotor Activity  Psychomotor Activity: Psychomotor Activity: Normal  Musculoskeletal: Strength & Muscle Tone: within normal limits Gait & Station: normal Assets  Assets: Manufacturing systems engineer; Resilience; Social Support   Sleep  Sleep: Sleep: Fair    Physical Exam: Physical Exam Vitals and nursing note reviewed.    ROS Blood pressure 127/85, pulse (!) 118, temperature (!) 97.2 F (36.2 C), resp. rate 18, height 5' 8 (1.727 m), weight 102.1 kg, SpO2 98%. Body mass index is 34.21 kg/m.   Social History   Tobacco Use  Smoking  Status Former   Types: Cigarettes  Smokeless Tobacco Never   Tobacco Cessation:  N/A, patient does not currently use tobacco products   Blood Alcohol level:  Lab Results  Component Value Date   ETH 220 (H) 11/09/2023   ETH 255 (H) 02/01/2016    Metabolic Disorder Labs:  Lab Results  Component Value Date   HGBA1C 6.5 (H) 11/12/2023   MPG 139.85 11/12/2023   MPG 395 (H) 03/14/2010   No results found for: PROLACTIN Lab Results  Component Value Date   CHOL 117 11/12/2023   TRIG 51 11/12/2023   HDL 38 (L) 11/12/2023   CHOLHDL 3.1 11/12/2023   VLDL 10 11/12/2023   LDLCALC 69 11/12/2023    See Psychiatric Specialty Exam and Suicide Risk Assessment completed by Attending Physician prior to discharge.  Discharge destination:  Home  Is patient on multiple antipsychotic therapies at discharge:  No   Has Patient had three or more failed trials of antipsychotic monotherapy by history:  No  Recommended Plan for Multiple Antipsychotic Therapies: NA  Discharge Instructions     Diet - low sodium heart healthy   Complete by: As directed    Increase activity slowly   Complete  by: As directed       Allergies as of 11/15/2023       Reactions   Hydrocodone Other (See Comments)   Panic attack   Robitussin Dm Max Day-night Hives        Medication List     STOP taking these medications    metFORMIN  500 MG 24 hr tablet Commonly known as: GLUCOPHAGE -XR Replaced by: metFORMIN  500 MG tablet       TAKE these medications      Indication  albuterol 108 (90 Base) MCG/ACT inhaler Commonly known as: VENTOLIN HFA Inhale 2 puffs into the lungs every 6 (six) hours as needed for shortness of breath.  Indication: Asthma   ARIPiprazole  5 MG tablet Commonly known as: ABILIFY  Take 1 tablet (5 mg total) by mouth daily. Start taking on: November 16, 2023  Indication: Major Depressive Disorder   atorvastatin  80 MG tablet Commonly known as: LIPITOR Take 80 mg by mouth daily.   Indication: Increased Fats, Triglycerides & Cholesterol in the Blood   Farxiga 10 MG Tabs tablet Generic drug: dapagliflozin propanediol Take 10 mg by mouth daily.  Indication: Type 2 Diabetes   fluticasone  50 MCG/ACT nasal spray Commonly known as: FLONASE  Place 2 sprays into both nostrils daily.  Indication: Allergic Rhinitis   gabapentin  100 MG capsule Commonly known as: NEURONTIN  Take 100 mg by mouth 3 (three) times daily.  Indication: Alcohol Withdrawal Syndrome   levothyroxine  50 MCG tablet Commonly known as: SYNTHROID  Take 3.5 tablets (175 mcg total) by mouth daily at 6 (six) AM AND 2 tablets (100 mcg total) daily at 6 (six) AM. Start taking on: November 16, 2023 What changed:  medication strength See the new instructions. Another medication with the same name was removed. Continue taking this medication, and follow the directions you see here.  Indication: Underactive Thyroid    metFORMIN  500 MG tablet Commonly known as: GLUCOPHAGE  Take 1 tablet (500 mg total) by mouth 2 (two) times daily with a meal. Replaces: metFORMIN  500 MG 24 hr tablet  Indication: Type 2 Diabetes   Ozempic (1 MG/DOSE) 4 MG/3ML Sopn Generic drug: Semaglutide (1 MG/DOSE) Inject 1 mg into the skin once a week.  Indication: Type 2 Diabetes   sertraline  50 MG tablet Commonly known as: ZOLOFT  Take 1 tablet (50 mg total) by mouth daily. Start taking on: November 16, 2023  Indication: Major Depressive Disorder        Follow-up Information     Services, [facility]. Go to.   Why: In person assessment for therapy and medication management is 11/18/23 at 11:00 AM. Contact information: [address] [place] [address] [phone]         [facility] Follow up.   Why: In person SAIOP (Substance Abuse Intensive Outpatient) assessment 11/23/23 at 10:00 AM w/ [facility]. Contact information: Address: [address], [place], [place] [address]  Phone: [phone]  Fax: [phone]                 Follow-up recommendations:  Activity:  As tolerated    Signed: Janett Kamath, MD 11/15/2023, 12:09 PM

## 2023-11-15 NOTE — Progress Notes (Signed)
 Discharge Note:  Patient denies SI/HI/AVH at this time. Discharge instructions, AVS, prescriptions, and transition record reviewed with patient. Patient agrees to comply with medication management, follow-up visit, and outpatient therapy. Patient belongings returned to patient. Patient questions and concerns addressed and answered. Patient ambulatory off unit. @ 1439,Patient discharged to home self care.

## 2023-11-26 ENCOUNTER — Other Ambulatory Visit (HOSPITAL_COMMUNITY): Payer: Self-pay
